# Patient Record
Sex: Male | Born: 1950 | Race: White | Hispanic: No | Marital: Married | Smoking: Never smoker
Health system: Southern US, Community
[De-identification: ages and names within clinical notes are randomized; demographics above are authoritative.]

## PROBLEM LIST (undated history)

## (undated) DIAGNOSIS — I4891 Unspecified atrial fibrillation: Secondary | ICD-10-CM

## (undated) DIAGNOSIS — G4730 Sleep apnea, unspecified: Secondary | ICD-10-CM

## (undated) DIAGNOSIS — K621 Rectal polyp: Secondary | ICD-10-CM

## (undated) DIAGNOSIS — K635 Polyp of colon: Secondary | ICD-10-CM

## (undated) HISTORY — PX: HERNIA REPAIR: SHX51

---

## 2001-01-28 ENCOUNTER — Ambulatory Visit (HOSPITAL_COMMUNITY): Admission: RE | Admit: 2001-01-28 | Discharge: 2001-01-28 | Payer: Self-pay | Admitting: General Surgery

## 2002-02-03 ENCOUNTER — Ambulatory Visit (HOSPITAL_COMMUNITY): Admission: RE | Admit: 2002-02-03 | Discharge: 2002-02-03 | Payer: Self-pay | Admitting: General Surgery

## 2002-11-03 ENCOUNTER — Ambulatory Visit (HOSPITAL_COMMUNITY): Admission: RE | Admit: 2002-11-03 | Discharge: 2002-11-03 | Payer: Self-pay | Admitting: Internal Medicine

## 2006-01-28 ENCOUNTER — Ambulatory Visit: Payer: Self-pay | Admitting: Internal Medicine

## 2006-01-28 ENCOUNTER — Ambulatory Visit (HOSPITAL_COMMUNITY): Admission: RE | Admit: 2006-01-28 | Discharge: 2006-01-28 | Payer: Self-pay | Admitting: Internal Medicine

## 2006-02-04 ENCOUNTER — Observation Stay (HOSPITAL_COMMUNITY): Admission: RE | Admit: 2006-02-04 | Discharge: 2006-02-05 | Payer: Self-pay | Admitting: General Surgery

## 2011-01-18 ENCOUNTER — Encounter (INDEPENDENT_AMBULATORY_CARE_PROVIDER_SITE_OTHER): Payer: Self-pay | Admitting: *Deleted

## 2011-02-14 ENCOUNTER — Telehealth (INDEPENDENT_AMBULATORY_CARE_PROVIDER_SITE_OTHER): Payer: Self-pay | Admitting: *Deleted

## 2011-02-14 DIAGNOSIS — Z86010 Personal history of colonic polyps: Secondary | ICD-10-CM

## 2011-02-14 MED ORDER — PEG-KCL-NACL-NASULF-NA ASC-C 100 G PO SOLR: 1.0000 | Freq: Once | ORAL | Status: DC

## 2011-02-14 NOTE — Telephone Encounter (Signed)
TCS sch'd 05/30/11 @ 8:30 (7:30), movi prep instructions mailed

## 2011-05-30 ENCOUNTER — Ambulatory Visit: Admit: 2011-05-30 | Payer: Self-pay | Admitting: Internal Medicine

## 2011-05-30 SURGERY — COLONOSCOPY
Anesthesia: Moderate Sedation

## 2011-11-13 ENCOUNTER — Encounter (INDEPENDENT_AMBULATORY_CARE_PROVIDER_SITE_OTHER): Payer: Self-pay | Admitting: *Deleted

## 2011-11-29 ENCOUNTER — Telehealth (INDEPENDENT_AMBULATORY_CARE_PROVIDER_SITE_OTHER): Payer: Self-pay | Admitting: *Deleted

## 2011-11-29 ENCOUNTER — Other Ambulatory Visit (INDEPENDENT_AMBULATORY_CARE_PROVIDER_SITE_OTHER): Payer: Self-pay | Admitting: *Deleted

## 2011-11-29 DIAGNOSIS — Z1211 Encounter for screening for malignant neoplasm of colon: Secondary | ICD-10-CM

## 2011-11-29 DIAGNOSIS — Z86010 Personal history of colonic polyps: Secondary | ICD-10-CM

## 2011-11-29 NOTE — Telephone Encounter (Signed)
Patient needs movi prep 

## 2011-11-30 MED ORDER — PEG-KCL-NACL-NASULF-NA ASC-C 100 G PO SOLR: 1.0000 | Freq: Once | ORAL | Status: DC

## 2012-01-08 ENCOUNTER — Encounter (INDEPENDENT_AMBULATORY_CARE_PROVIDER_SITE_OTHER): Payer: Self-pay | Admitting: *Deleted

## 2012-01-09 ENCOUNTER — Encounter (INDEPENDENT_AMBULATORY_CARE_PROVIDER_SITE_OTHER): Payer: Self-pay | Admitting: *Deleted

## 2012-02-06 ENCOUNTER — Telehealth (INDEPENDENT_AMBULATORY_CARE_PROVIDER_SITE_OTHER): Payer: Self-pay | Admitting: *Deleted

## 2012-02-06 NOTE — Telephone Encounter (Signed)
PCP/Requesting MD: Sudie Bailey  Name & DOB: allin frix 11-01-2050     Procedure: tcs  Reason/Indication:  Hx polyps  Has patient had this procedure before?  yes  If so, when, by whom and where?  01/2006  Is there a family history of colon cancer?  no  Who?  What age when diagnosed?    Is patient diabetic?   no      Does patient have prosthetic heart valve?  no  Do you have a pacemaker?  no  Has patient had joint replacement within last 12 months?  no  Is patient on Coumadin, Plavix and/or Aspirin? no  Medications: none  Allergies: nkda  Medication Adjustment:   Procedure date & time: 02/27/12 at 830

## 2012-02-08 NOTE — Telephone Encounter (Signed)
agree

## 2012-02-14 ENCOUNTER — Encounter (HOSPITAL_COMMUNITY): Payer: Self-pay | Admitting: Pharmacy Technician

## 2012-02-26 MED ORDER — SODIUM CHLORIDE 0.45 % IV SOLN: Freq: Once | INTRAVENOUS | Status: DC

## 2012-02-27 ENCOUNTER — Encounter (HOSPITAL_COMMUNITY)
Admission: RE | Disposition: A | Discharge status: Home / Self Care | Payer: Self-pay | Source: Ambulatory Visit | Attending: Internal Medicine

## 2012-02-27 ENCOUNTER — Encounter (HOSPITAL_COMMUNITY): Payer: Self-pay | Admitting: *Deleted

## 2012-02-27 ENCOUNTER — Ambulatory Visit (HOSPITAL_COMMUNITY)
Admission: RE | Admit: 2012-02-27 | Discharge: 2012-02-27 | Disposition: A | Discharge status: Home / Self Care | Payer: BC Managed Care – PPO | Source: Ambulatory Visit | Attending: Internal Medicine | Admitting: Internal Medicine

## 2012-02-27 DIAGNOSIS — D128 Benign neoplasm of rectum: Secondary | ICD-10-CM | POA: Insufficient documentation

## 2012-02-27 DIAGNOSIS — K5730 Diverticulosis of large intestine without perforation or abscess without bleeding: Secondary | ICD-10-CM

## 2012-02-27 DIAGNOSIS — Z860100 Personal history of colon polyps, unspecified: Secondary | ICD-10-CM | POA: Insufficient documentation

## 2012-02-27 DIAGNOSIS — D129 Benign neoplasm of anus and anal canal: Secondary | ICD-10-CM | POA: Insufficient documentation

## 2012-02-27 DIAGNOSIS — D126 Benign neoplasm of colon, unspecified: Secondary | ICD-10-CM

## 2012-02-27 HISTORY — PX: COLONOSCOPY: SHX5424

## 2012-02-27 HISTORY — DX: Polyp of colon: K63.5

## 2012-02-27 HISTORY — DX: Rectal polyp: K62.1

## 2012-02-27 SURGERY — COLONOSCOPY
Anesthesia: Moderate Sedation

## 2012-02-27 MED ORDER — STERILE WATER FOR IRRIGATION IR SOLN
Status: DC | PRN
  Administered 2012-02-27: 08:00:00

## 2012-02-27 MED ORDER — MEPERIDINE HCL 50 MG/ML IJ SOLN
INTRAMUSCULAR | Status: DC | PRN
  Administered 2012-02-27 (×2): 25 mg via INTRAVENOUS

## 2012-02-27 MED ORDER — MEPERIDINE HCL 50 MG/ML IJ SOLN
INTRAMUSCULAR | Status: AC
  Filled 2012-02-27: qty 1

## 2012-02-27 MED ORDER — MIDAZOLAM HCL 5 MG/5ML IJ SOLN
INTRAMUSCULAR | Status: DC | PRN
  Administered 2012-02-27 (×5): 2 mg via INTRAVENOUS

## 2012-02-27 MED ORDER — MIDAZOLAM HCL 5 MG/5ML IJ SOLN
INTRAMUSCULAR | Status: AC
  Filled 2012-02-27: qty 10

## 2012-02-27 NOTE — Op Note (Signed)
COLONOSCOPY PROCEDURE REPORT  PATIENT:  Robert Gillespie  MR#:  [id number] Birthdate:  25-May-1951, 61 y.o., male Endoscopist:  Dr. Malissa Hippo, MD Referred By:  Dr. Mila Homer. Sudie Bailey, MD Procedure Date: 02/27/2012  Procedure:   Colonoscopy and snare polypectomy.  Indications:  Patient is 61 year old Caucasian male with history of colonic adenomas. He's had multiple adenomas removed and 2 prior colonoscopies. His last exam was in August 2007.  Informed Consent:  The procedure and risks were reviewed with the patient and informed consent was obtained.  Medications:  Demerol 50 mg IV Versed 10 mg IV  Description of procedure:  After a digital rectal exam was performed, that colonoscope was advanced from the anus through the rectum and colon to the area of the cecum, ileocecal valve and appendiceal orifice. The cecum was deeply intubated. These structures were well-seen and photographed for the record. From the level of the cecum and ileocecal valve, the scope was slowly and cautiously withdrawn. The mucosal surfaces were carefully surveyed utilizing scope tip to flexion to facilitate fold flattening as needed. The scope was pulled down into the rectum where a thorough exam including retroflexion was performed.  Findings:   Prep excellent. Scattered diverticula at sigmoid and descending colon. Or small polyps are ablated via cold biopsy and submitted in one container(1 at ascending colon and 3 at transverse colon). 7 mm sessile polyp snared from hepatic flexure following saline injection. 7-8 mm polyp snared from splenic flexure. 12 mm polyp snared from rectosigmoid junction. One small polyp at descending colon was coagulated using snare tip. Normal rectal mucosa and anal rectal junction.  Therapeutic/Diagnostic Maneuvers Performed:  see above  Complications:  None  Cecal Withdrawal Time:  14 minutes  Impression:  Examination performed to cecum. Scattered diverticula at sigmoid  and descending. Four small polyps ablated via cold biopsy and submitted together. Small polyp at descending colon was ablated using snare tip. Three polyps were snared as above. Largest one was located at rectosigmoid junction and was 12 mm in diameter.   Recommendations:  Standard instructions given. Will contact patient with results of biopsy and further recommendations.  Nadim Malia U  02/27/2012 9:31 AM  CC: Dr. Milana Obey, MD & Dr. Bonnetta Barry ref. provider found

## 2012-02-27 NOTE — H&P (Addendum)
Robert Gillespie is an 61 y.o. male.   Chief Complaint: Patient is here for colonoscopy. HPI: Patient is 61 year old Caucasian male with history of colonic adenomas and is here for surveillance examination. He has had multiple adenomas removed on his to prior exams. The first one was in 2004 and her last exam was in August 2007 and he had 5 small polyps removed and they were all tubular adenomas.. He denies change in his bowel habits abdominal pain rectal bleeding. Family history is negative for colorectal carcinoma to He does not take any medications.  Past Medical History  Diagnosis Date  . Colorectal polyps     history of     Past Surgical History  Procedure Date  . Hernia repair     History reviewed. No pertinent family history. Social History:  reports that he has never smoked. He does not have any smokeless tobacco history on file. He reports that he drinks alcohol. He reports that he does not use illicit drugs.  Allergies: No Known Allergies  No prescriptions prior to admission    No results found for this or any previous visit (from the past 48 hour(s)). No results found.  ROS  Blood pressure 128/70, pulse 54, temperature 97.6 F (36.4 C), temperature source Oral, resp. rate 19, height 6\' 4"  (1.93 m), weight 203 lb (92.08 kg), SpO2 95.00%. Physical Exam  Constitutional: He appears well-developed and well-nourished.  HENT:  Mouth/Throat: Oropharynx is clear and moist.  Eyes: Conjunctivae normal are normal. No scleral icterus.  Neck: No thyromegaly present.  Cardiovascular: Normal rate, regular rhythm and normal heart sounds.   Respiratory: Effort normal and breath sounds normal.  GI: Soft. He exhibits no distension and no mass. There is no tenderness.  Musculoskeletal: He exhibits no edema.  Lymphadenopathy:    He has no cervical adenopathy.  Neurological: He is alert.  Skin: Skin is warm and dry.     Assessment/Plan History of colonic adenomas. Surveillance  colonoscopy.  Marianela Mandrell U 02/27/2012, 8:23 AM

## 2012-02-29 ENCOUNTER — Encounter (HOSPITAL_COMMUNITY): Payer: Self-pay | Admitting: Internal Medicine

## 2012-03-12 ENCOUNTER — Encounter (INDEPENDENT_AMBULATORY_CARE_PROVIDER_SITE_OTHER): Payer: Self-pay | Admitting: *Deleted

## 2014-06-11 DIAGNOSIS — I639 Cerebral infarction, unspecified: Secondary | ICD-10-CM

## 2014-06-11 HISTORY — DX: Cerebral infarction, unspecified: I63.9

## 2014-06-11 HISTORY — PX: BRAIN AVM REPAIR: SHX202

## 2014-10-30 ENCOUNTER — Emergency Department
Admission: EM | Admit: 2014-10-30 | Discharge: 2014-10-30 | Disposition: A | Discharge status: Other Acute Inpatient Hospital | Payer: BLUE CROSS/BLUE SHIELD | Attending: Emergency Medicine | Admitting: Emergency Medicine

## 2014-10-30 ENCOUNTER — Emergency Department: Payer: BLUE CROSS/BLUE SHIELD

## 2014-10-30 ENCOUNTER — Encounter: Payer: Self-pay | Admitting: Emergency Medicine

## 2014-10-30 ENCOUNTER — Other Ambulatory Visit: Payer: Self-pay

## 2014-10-30 DIAGNOSIS — I619 Nontraumatic intracerebral hemorrhage, unspecified: Secondary | ICD-10-CM

## 2014-10-30 DIAGNOSIS — R51 Headache: Secondary | ICD-10-CM | POA: Diagnosis present

## 2014-10-30 DIAGNOSIS — I614 Nontraumatic intracerebral hemorrhage in cerebellum: Secondary | ICD-10-CM | POA: Insufficient documentation

## 2014-10-30 LAB — CBC
HEMATOCRIT: 45.1 % (ref 40.0–52.0)
HEMOGLOBIN: 15.3 g/dL (ref 13.0–18.0)
MCH: 33.3 pg (ref 26.0–34.0)
MCHC: 33.8 g/dL (ref 32.0–36.0)
MCV: 98.5 fL (ref 80.0–100.0)
PLATELETS: 164 10*3/uL (ref 150–440)
RBC: 4.58 MIL/uL (ref 4.40–5.90)
RDW: 12.7 % (ref 11.5–14.5)
WBC: 9.3 10*3/uL (ref 3.8–10.6)

## 2014-10-30 LAB — PROTIME-INR
INR: 0.9
Prothrombin Time: 12.4 seconds (ref 11.4–15.0)

## 2014-10-30 LAB — DIFFERENTIAL
BASOS PCT: 0 %
Basophils Absolute: 0 10*3/uL (ref 0–0.1)
Eosinophils Absolute: 0.1 10*3/uL (ref 0–0.7)
Eosinophils Relative: 1 %
LYMPHS ABS: 1.7 10*3/uL (ref 1.0–3.6)
LYMPHS PCT: 19 %
MONOS PCT: 8 %
Monocytes Absolute: 0.7 10*3/uL (ref 0.2–1.0)
NEUTROS ABS: 6.7 10*3/uL — AB (ref 1.4–6.5)
NEUTROS PCT: 72 %

## 2014-10-30 LAB — COMPREHENSIVE METABOLIC PANEL
ALK PHOS: 69 U/L (ref 38–126)
ALT: 16 U/L — AB (ref 17–63)
AST: 21 U/L (ref 15–41)
Albumin: 4.5 g/dL (ref 3.5–5.0)
Anion gap: 8 (ref 5–15)
BUN: 12 mg/dL (ref 6–20)
CALCIUM: 9.6 mg/dL (ref 8.9–10.3)
CHLORIDE: 104 mmol/L (ref 101–111)
CO2: 26 mmol/L (ref 22–32)
CREATININE: 0.67 mg/dL (ref 0.61–1.24)
GFR calc non Af Amer: >60 mL/min (ref >60)
GLUCOSE: 102 mg/dL — AB (ref 65–99)
Potassium: 4.2 mmol/L (ref 3.5–5.1)
SODIUM: 138 mmol/L (ref 135–145)
Total Bilirubin: 0.5 mg/dL (ref 0.3–1.2)
Total Protein: 7.1 g/dL (ref 6.5–8.1)

## 2014-10-30 LAB — APTT: APTT: 26 s (ref 24–36)

## 2014-10-30 MED ORDER — LABETALOL HCL 5 MG/ML IV SOLN
10.0000 mg | Freq: Once | INTRAVENOUS | Status: AC
  Administered 2014-10-30: 10 mg via INTRAVENOUS

## 2014-10-30 MED ORDER — LABETALOL HCL 5 MG/ML IV SOLN
INTRAVENOUS | Status: AC
  Administered 2014-10-30: 10 mg via INTRAVENOUS
  Filled 2014-10-30: qty 4

## 2014-10-30 NOTE — ED Notes (Signed)
MD at bedside updating patient on CT results. Family at bedside.

## 2014-10-30 NOTE — ED Notes (Signed)
Headache began 3p, affecting vision and balance

## 2014-10-30 NOTE — ED Provider Notes (Signed)
[facility] Emergency Department Provider Note  ____________________________________________  Time seen: 1700  I have reviewed the triage vital signs and the nursing notes.   HISTORY  Chief Complaint Headache, off balance, vertigo    HPI Robert Gillespie is a 64 y.o. male who reports he had a headache on Wednesday. It was relatively brief and went away. This afternoon at 3:00 I headache returned. It was in the right side of his head but has now shifted towards the back and left part of his head. He reports he is having some difficulty walking due to feeling off balance. He says his vision appears a little bit blurry. He is alert and communicative. He denies history of similar in the past. He denies any focal weakness.   Past Medical History  Diagnosis Date  . Colorectal polyps     history of     There are no active problems to display for this patient.   Past Surgical History  Procedure Laterality Date  . Hernia repair    . Colonoscopy  02/27/2012    Procedure: COLONOSCOPY;  Surgeon: Rogene Houston, MD;  [facility];  Service: Endoscopy;  Laterality: N/A;  8:30    Current Outpatient Rx  Name  Route  Sig  Dispense  Refill  . HYDROcodone-acetaminophen (NORCO) 7.5-325 MG per tablet   Oral   Take 1 tablet by mouth 2 (two) times daily as needed for moderate pain or severe pain (for back pain).         Marland Kitchen ibuprofen (ADVIL,MOTRIN) 800 MG tablet   Oral   Take 800 mg by mouth every 8 (eight) hours as needed for mild pain or moderate pain.           Allergies Review of patient's allergies indicates no known allergies.  History reviewed. No pertinent family history.  Social History History  Substance Use Topics  . Smoking status: Never Smoker   . Smokeless tobacco: Not on file  . Alcohol Use: 3.6 - 4.8 oz/week    6-8 Cans of beer per week     Comment: 6-8 beers a day    Review of Systems  Constitutional: Negative for  fever. ENT: Negative for sore throat. Cardiovascular: Negative for chest pain. Respiratory: Negative for shortness of breath. Gastrointestinal: Negative for abdominal pain, vomiting and diarrhea. Genitourinary: Negative for dysuria. Musculoskeletal: Negative for back pain. Skin: Negative for rash. Neurological: Positive for headache and vertigo. See history of present illness. 10-point ROS otherwise negative.  ____________________________________________   PHYSICAL EXAM:  VITAL SIGNS: ED Triage Vitals  Enc Vitals Group     BP 10/30/14 1648 167/83 mmHg     Pulse Rate 10/30/14 1648 63     Resp 10/30/14 1648 20     Temp 10/30/14 1648 98.1 F (36.7 C)     Temp Source 10/30/14 1648 Oral     SpO2 10/30/14 1648 98 %     Weight 10/30/14 1648 200 lb (90.719 kg)     Height 10/30/14 1648 6\' 3"  (1.905 m)     Head Cir --      Peak Flow --      Pain Score 10/30/14 1649 6     Pain Loc --      Pain Edu? --      Excl. in [place]? --     Constitutional: Alert and oriented. Well appearing and in no distress. ENT   Head: Normocephalic and atraumatic.   Nose: No congestion/rhinnorhea.  Mouth/Throat: Mucous membranes are moist. Cardiovascular: Normal rate, regular rhythm. Respiratory: Normal respiratory effort without tachypnea. Breath sounds are clear and equal bilaterally. No wheezes/rales/rhonchi. Gastrointestinal: Soft and nontender. No distention.  Back: There is no CVA tenderness. Musculoskeletal: Nontender with normal range of motion in all extremities.  No noted edema. Neurologic:  Normal speech and language. No gross focal neurologic deficits are appreciated.  Equal grip strength. Negative pronator drift. No sway with eyes closed during the pronator drift assessment SEATED in bed. Normal finger to nose. Normal hand flap on thigh. Normal heel to shin bilaterally. 5 or 5 strength in all 4 extremities. Cranial nerves II through XII are intact. The patient is alert and  communicative. Skin:  Skin is warm, dry. No rash noted. Psychiatric: Mood and affect are normal. Speech and behavior are normal.  ____________________________________________    LABS (pertinent positives/negatives)  CBC is within normal limits with white blood count 9.3 hemoglobin of 15.3. [name] has a metabolic panel is within normal limits with a potassium of 4.2. coagulation studies are normal without PT of 12.4 INR of 0.9 and a PTT of 26. ____________________________________________   EKG  EKG obtained at 1657. Interpreted by me. Normal sinus rhythm at a rate of 60 with normal intervals and normal axis. Normal EKG.  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 Mild local mass-effect in the fourth ventricle. Differential diagnosis includes hypertension, cocaine, vascular malformation, and tumor. Further imaging workup is suggested.  ____________________________________________   PROCEDURES  Critical Care performed: Critically ill patient with a and acute hemorrhagic CVA. We have treated him with labetalol IV to reduce his blood pressure. We have called [facility] and spoken with the neuro intensivist for ongoing care. We have spoken with the patient and his family. Total critical care time of 35 minutes.   ____________________________________________   INITIAL IMPRESSION / ASSESSMENT AND PLAN / ED COURSE  Acute hemorrhagic stroke. Treat blood pressure with labetalol IV. Call to [facility] after discussing transfer with patient.  ----------------------------------------- 5:56 PM on 10/30/2014 -----------------------------------------  Discussed with Dr. Kasandra Knudsen at [facility]. He accepts the patient in transfer. We are waiting on word about critical care transport availability. ____________________________________________   FINAL  CLINICAL IMPRESSION(S) / ED DIAGNOSES  Final diagnoses:  Hemorrhagic stroke   acute, cerebellum. Headache, vertigo    Ahmed Prima, MD 10/30/14 2128

## 2014-11-29 ENCOUNTER — Encounter: Payer: Self-pay | Admitting: Physical Therapy

## 2014-11-29 ENCOUNTER — Ambulatory Visit: Payer: BLUE CROSS/BLUE SHIELD | Attending: Neurological Surgery | Admitting: Physical Therapy

## 2014-11-29 DIAGNOSIS — Q282 Arteriovenous malformation of cerebral vessels: Secondary | ICD-10-CM | POA: Insufficient documentation

## 2014-11-29 DIAGNOSIS — R41841 Cognitive communication deficit: Secondary | ICD-10-CM | POA: Diagnosis not present

## 2014-11-29 DIAGNOSIS — R262 Difficulty in walking, not elsewhere classified: Secondary | ICD-10-CM | POA: Insufficient documentation

## 2014-11-29 NOTE — Therapy (Signed)
[place] [address] [place], [place], [address] Phone: [phone]   Fax:  [phone]  Physical Therapy Evaluation  Patient Details  Name: Robert Gillespie MRN: [id number] Date of Birth: Feb 21, 1951 Referring Provider:  Lemmie Evens, MD  Encounter Date: 11/29/2014      PT End of Session - 11/29/14 1504    Visit Number 1   Number of Visits 25   Date for PT Re-Evaluation 03/01/15   PT Start Time 0145   PT Stop Time 0245   PT Time Calculation (min) 60 min   Equipment Utilized During Treatment Gait belt   Activity Tolerance Patient tolerated treatment well   Behavior During Therapy [facility] for tasks assessed/performed      Past Medical History  Diagnosis Date  . Colorectal polyps     history of     Past Surgical History  Procedure Laterality Date  . Hernia repair    . Colonoscopy  02/27/2012    Procedure: COLONOSCOPY;  Surgeon: Rogene Houston, MD;  [facility];  Service: Endoscopy;  Laterality: N/A;  8:30    There were no vitals filed for this visit.  Visit Diagnosis:  Difficulty walking      Subjective Assessment - 11/29/14 1348    Subjective Patient wants to be able to ambulate wihtout the RW, and have better balance.             [facility] PT Assessment - 11/29/14 0001    Assessment   Medical Diagnosis AVM   Onset Date/Surgical Date 10/30/14   Hand Dominance Right   Next MD Visit December 30, 2014   Prior Therapy no   Precautions   Precautions Fall   Balance Screen   Has the patient fallen in the past 6 months Yes   How many times? 1   Has the patient had a decrease in activity level because of a fear of falling?  Yes   Is the patient reluctant to leave their home because of a fear of falling?  Yes   [place] Private residence   Living Arrangements Spouse/significant other   Available Help at Discharge Family   Type of [place] to  enter   Entrance Stairs-Number of Steps [place] One level   [place] - 2 wheels   Prior Function   Level of Independence Independent   Vocation Full time employment   Vocation Requirements amusement buisness   Leisure work   Functional Tests   Functional tests Sit to [employer] to Stand   Comments 5 x sit to stand 36.56 sec       10 MW .83 m/sec    TUG n 24.43 sec  6 MW700 feet    Berg         Standardized Balance Assessment   Standardized Balance Assessment Berg Balance Test   Berg Balance Test   Sit to Stand Able to stand  independently using hands   Standing Unsupported Able to stand safely 2 minutes   Sitting with Back Unsupported but Feet Supported on Floor or Stool Able to sit safely and securely 2 minutes   Stand to Sit Controls descent by using hands   Transfers Able to transfer with verbal cueing and /or supervision   Standing Unsupported with Eyes Closed Able to stand 10 seconds safely  Standing Ubsupported with Feet Together Able to place feet together independently and stand 1 minute safely   From Standing, Reach Forward with Outstretched Arm Can reach forward >12 cm safely (5")   From Standing Position, Pick up Object from Floor Able to pick up shoe, needs supervision   From Standing Position, Turn to Look Behind Over each Shoulder Turn sideways only but maintains balance   Turn 360 Degrees Able to turn 360 degrees safely but slowly   Standing Unsupported, Alternately Place Feet on Step/Stool Able to complete >2 steps/needs minimal assist   Standing Unsupported, One Foot in [employer] balance while stepping or standing   Standing on One Leg Unable to try or needs assist to prevent fall   Total Score 35       Patient is independent with bed mobility supine to sit and sit to supine Transfers and ambulation are SBA for household distances with RW Strength is 5/5 BLE knees, hips  Ext 3/5, abd 4/5, add 3+/5,  Patient  has + elys bilaterally and tight hip flexor muscles bilaterally Dynamic standing balance is poor static standing is fair Seated dynamic and static balance is good                    PT Education - 11/29/14 1504    Education provided Yes   Education Details HEP   Person(s) Educated Patient   Methods Explanation;Demonstration;Tactile cues   Comprehension Verbalized understanding;Verbal cues required             PT Long Term Goals - 11/29/14 1509    PT LONG TERM GOAL #1   Title Patient will increase 10 meter walk test to >1.50m/s as to improve gait speed for better community ambulation and to reduce fall risk  03-01-15   PT LONG TERM GOAL #2   Title Patient will increase Berg Balance score by > 6 points to demonstrate decreased fall risk during functional activities. 03-01-15   PT LONG TERM GOAL #3   Title Patient will reduce timed up and go to <11 seconds to reduce fall risk and demonstrate improved transfer/gait ability 03-01-15   PT LONG TERM GOAL #4   Title Patient will be independent in home exercise program to improve strength/mobility for better functional independence with ADLs. 03-01-15   PT LONG TERM GOAL #5   Title Patient will tolerate 5 seconds of single leg stance without loss of balance to improve ability to get in and out of shower safely 03-01-15   PT LONG TERM GOAL #6   Title Patient will increase BLE gross strength to 4+/5 as to improve functional strength for independent gait, increased standing tolerance and increased ADL ability.Patient will increase BLE gross strength to 4+/5 as to improve functional strength for independent gait, increased standing tolerance and increased ADL ability.                Problem List There are no active problems to display for this patient.   Arelia Sneddon S 11/29/2014, 3:17 PM  [place] [facility] SERVICES [address] [place], [place], [address] Phone:  [phone]   Fax:  [phone]

## 2014-12-01 ENCOUNTER — Ambulatory Visit: Payer: BLUE CROSS/BLUE SHIELD | Admitting: Physical Therapy

## 2014-12-01 ENCOUNTER — Encounter: Payer: Self-pay | Admitting: Physical Therapy

## 2014-12-01 DIAGNOSIS — R262 Difficulty in walking, not elsewhere classified: Secondary | ICD-10-CM

## 2014-12-01 NOTE — Patient Instructions (Signed)
SIT TO STAND: No Device   Sit with feet shoulder-width apart, on floor.(Make sure that you are in a chair that won't move like a chair against a wall or couch etc) Lean chest forward, raise hips up from surface. Straighten hips and knees. Weight bear equally on left and right sides. 10___ reps per set, _2__ sets per day, _5__ days per week Place left leg closer to sitting surface.  Balance, Proprioception: Hip Abduction With Tubing   With tubing attached to both ankles, Standing holding onto counter, kick one leg out to side and then Return.  Repeat _10___ times  On each side.  Do ___2_ sessions per day.  http://cc.exer.us/20   Copyright  VHI. All rights reserved.    Copyright  VHI. All rights reserved.  Balance, Proprioception: Hip Extension With Tubing   With tubing tied around both legs, holding onto kitchen counter, swing leg back. Return. Be sure to avoid forward trunk lean Repeat _10___ times . Do __2__ sessions per day.  http://cc.exer.us/19   Copyright  VHI. All rights reserved.  Balance, Proprioception: Hip Flexion With Tubing   With tubing attached to both ankles, swing leg forward. Return. Repeat _10___ times. Do __2__ sessions per day.  http://cc.exer.us/18   Copyright  VHI. All rights reserved.

## 2014-12-01 NOTE — Therapy (Signed)
[place] [facility] SERVICES [address] [place], [place], [address] Phone: [phone]   Fax:  [phone]  Physical Therapy Treatment  Patient Details  Name: Robert Gillespie MRN: [id number] Date of Birth: 07-09-1950 Referring Provider:  Lemmie Evens, MD  Encounter Date: 12/01/2014      PT End of Session - 12/01/14 1116    Visit Number 2   Number of Visits 25   Date for PT Re-Evaluation 03/01/15   PT Start Time 1115   PT Stop Time 1200   PT Time Calculation (min) 45 min   Equipment Utilized During Treatment Gait belt   Activity Tolerance Patient tolerated treatment well   Behavior During Therapy [facility] for tasks assessed/performed      Past Medical History  Diagnosis Date  . Colorectal polyps     history of     Past Surgical History  Procedure Laterality Date  . Hernia repair    . Colonoscopy  02/27/2012    Procedure: COLONOSCOPY;  Surgeon: Rogene Houston, MD;  [facility];  Service: Endoscopy;  Laterality: N/A;  8:30    There were no vitals filed for this visit.  Visit Diagnosis:  Difficulty walking      Subjective Assessment - 12/01/14 1020    Subjective Pt reports that he is doing good upon arrival to PT on this day. Pt reports that he has been compliant with HEP, performing these exercise once since last PT visit; sit to stand, standing hip Abduction, hip flexion, and hip extension.      Patient is accompained by: Family member   Limitations Walking;Lifting;Standing   Patient Stated Goals improve balance and decrease dependance on AD    Currently in Pain? No/denies   Multiple Pain Sites --             TREATMENT:   Nustep level 2 4 min (unbilled)   HEP Review:  Sit to stand x10 with use of hand rail Standing hip flexion yellow tband , x10 each leg  Standing hip Extension Yellow tband x10 each leg Standing hip abduction  Yellow tband x10 each leg.   Verbal instruction for improve exercise  technique and decreased use of compensatory movements, including decreased trunk movement and improved eccentric control with standing exercises and increased speed of movement with sit to stand   Balance therapeutic exercises.    Standing feet together eyes open 30 seconds x2, eyes closed 30 seconds x2  Tandem stance, bilaterally 1-0 HHA 2x30 seconds each LE, able to keep balance without HHA up to 8 seconds with R, and 10 seconds with L SLS Bilaterally 1-0 HHA, 2x30 seconds each LE, able to keep balance without HHA for up to 7 seconds on R and 12 seconds on L.  Step up to 5 inch step, no HHA 15 each LE. Posterior LOB x1 required use of hands on parallel bars to correct  Toe taps on 4 inch step, no HHA 15 each LE. Posterior LOB x 3 required use of hands on parallel bars to correct  For balance activities PT provided CGA for increased safety with LOB, verbal instruction and tactile cues to increase use of hip strategy with posterior LOB, and proper technique with exercises including improved eccentric control with step up/down.                       PT Education - 12/01/14 1116    Education provided Yes  Education Details HEP review, balance exercises, proper use of RW for household and community ambulation   Person(s) Educated Patient;Spouse   Methods Explanation;Demonstration;Tactile cues;Verbal cues;Handout   Comprehension Verbalized understanding;Returned demonstration             PT Long Term Goals - 11/29/14 1509    PT LONG TERM GOAL #1   Title Patient will increase 10 meter walk test to >1.52m/s as to improve gait speed for better community ambulation and to reduce fall risk  03-01-15   PT LONG TERM GOAL #2   Title Patient will increase Berg Balance score by > 6 points to demonstrate decreased fall risk during functional activities. 03-01-15   PT LONG TERM GOAL #3   Title Patient will reduce timed up and go to <11 seconds to reduce fall risk and demonstrate  improved transfer/gait ability 03-01-15   PT LONG TERM GOAL #4   Title Patient will be independent in home exercise program to improve strength/mobility for better functional independence with ADLs. 03-01-15   PT LONG TERM GOAL #5   Title Patient will tolerate 5 seconds of single leg stance without loss of balance to improve ability to get in and out of shower safely 03-01-15   PT LONG TERM GOAL #6   Title Patient will increase BLE gross strength to 4+/5 as to improve functional strength for independent gait, increased standing tolerance and increased ADL ability.Patient will increase BLE gross strength to 4+/5 as to improve functional strength for independent gait, increased standing tolerance and increased ADL ability.               Plan - 12/01/14 1117    Clinical Impression Statement PT provided pt with review of HEP, correction provided for improper standing hip exercise technique. Pt demonstrates decreased stabilty with narrow BOS balance tasks and decreased righting responses for posterior LOB. PT educated pt with use of hip strategy to correct LOB, with practice pt was able to demonstrate improved balance correction. Continued skilled PT required to improve LE strength, increase endurance, and improve balance to increase indepence with ADLS.    PT Next Visit Plan balance exercises. LE strengthening exercises   PT Home Exercise Plan see pt instructions   Consulted and Agree with Plan of Care Patient        Problem List There are no active problems to display for this patient.  Barrie Folk SPT 12/01/2014   11:35 AM   Hopkins,Margaret, PT DPT 12/01/2014, 1:08 PM  [place] MA[facility] SERVICES [address] [place], [place], [address] Phone: [phone]   Fax:  [phone]

## 2014-12-06 ENCOUNTER — Encounter: Payer: Self-pay | Admitting: Physical Therapy

## 2014-12-06 ENCOUNTER — Ambulatory Visit: Payer: BLUE CROSS/BLUE SHIELD | Admitting: Physical Therapy

## 2014-12-06 DIAGNOSIS — R262 Difficulty in walking, not elsewhere classified: Secondary | ICD-10-CM

## 2014-12-06 NOTE — Therapy (Signed)
[place] MAIN [facility] SERVICES [address] [place], [place], [address] Phone: [phone]   Fax:  [phone]  Physical Therapy Treatment  Patient Details  Name: Robert Gillespie MRN: [id number] Date of Birth: February 12, 1951 Referring Provider:  Lemmie Evens, MD  Encounter Date: 12/06/2014      PT End of Session - 12/06/14 1741    Visit Number 3   Number of Visits 25   Date for PT Re-Evaluation 03/01/15   PT Start Time 1633   PT Stop Time 1716   PT Time Calculation (min) 43 min   Equipment Utilized During Treatment Gait belt   Activity Tolerance Patient tolerated treatment well   Behavior During Therapy [facility] for tasks assessed/performed      Past Medical History  Diagnosis Date  . Colorectal polyps     history of     Past Surgical History  Procedure Laterality Date  . Hernia repair    . Colonoscopy  02/27/2012    Procedure: COLONOSCOPY;  Surgeon: Rogene Houston, MD;  [facility];  Service: Endoscopy;  Laterality: N/A;  8:30    There were no vitals filed for this visit.  Visit Diagnosis:  Difficulty walking      Subjective Assessment - 12/06/14 1640    Subjective Pt reports that he has been doing good, notes that he had a "slow weekend", but was able to complete his HEP twice a day since last PT session.  Pt states that he has had headaches "come and go" over the past couple of days.    Patient is accompained by: Family member   Limitations Walking;Lifting;Standing   Patient Stated Goals improve balance and decrease dependance on AD    Currently in Pain? No/denies     Treatment   nustep level 3,  3min (unbilled)    Ball toss standing on airex 1 min x2  Forward Step ups on 5 inch step 0-2HHA x15 each LE Lateral steps on 5 inch steps  2 HHA x15 each LE   Toe taps on 4 inch airex. 0-2 HHA,  2x15 each LE  Standing on airex normal BOS 30seconds x3 Standing on airex narow BOS30 seconds x3  PT provided CGA and  moderate verbal and tactile instruction to increase hip strategy to prevent posterior LOB, decrease speed with step ups, proper use of UE for balance correction.    Gait training  In parallel bars with one HHA, CGA 61ft x6  SPC CGA-MinA 50x4  RW cone naviation 70ft x3  Gait to pick up object x8 for 50 ft.   For gait with SPC, Pt demonstrated 3 point gait pattern and inconsistant AD placement. PT instructed pt in 2 point gait pattern, AD placement, increase erect posture. Pt required min Ax3 to prevent lateral/posterior LOB with poor AD placement.  For gait with RW, PT provided Supervision assist and provided verbal instruction to maintain COM within BOS to increase stability.                            PT Education - 12/06/14 1741    Education provided Yes   Education Details gait with SPC, balance therex.    Person(s) Educated Patient   Methods Explanation;Demonstration;Tactile cues;Verbal cues   Comprehension Verbalized understanding;Returned demonstration;Verbal cues required;Tactile cues required             PT Long Term Goals - 11/29/14 1509    PT  LONG TERM GOAL #1   Title Patient will increase 10 meter walk test to >1.77m/s as to improve gait speed for better community ambulation and to reduce fall risk  03-01-15   PT LONG TERM GOAL #2   Title Patient will increase Berg Balance score by > 6 points to demonstrate decreased fall risk during functional activities. 03-01-15   PT LONG TERM GOAL #3   Title Patient will reduce timed up and go to <11 seconds to reduce fall risk and demonstrate improved transfer/gait ability 03-01-15   PT LONG TERM GOAL #4   Title Patient will be independent in home exercise program to improve strength/mobility for better functional independence with ADLs. 03-01-15   PT LONG TERM GOAL #5   Title Patient will tolerate 5 seconds of single leg stance without loss of balance to improve ability to get in and out of shower safely 03-01-15    PT LONG TERM GOAL #6   Title Patient will increase BLE gross strength to 4+/5 as to improve functional strength for independent gait, increased standing tolerance and increased ADL ability.Patient will increase BLE gross strength to 4+/5 as to improve functional strength for independent gait, increased standing tolerance and increased ADL ability.               Plan - 12/06/14 1742    Clinical Impression Statement Pt demonstrates improved static balance, but continues to present with dynamic balance deficits, requiring PT to prevent LOB with balance exercises x2 and with gait training x3. Pt instructed in gait with SPC on this day, demonstrating inconsistant AD management  and poor SLS balance; with verbal and tactile instruction, pt was able to improve gait mechanics. Continued skilled PT required to improve gait, balance, and strength to improve function.    Pt will benefit from skilled therapeutic intervention in order to improve on the following deficits Abnormal gait;Decreased strength;Difficulty walking;Decreased mobility;Decreased balance;Impaired flexibility   Rehab Potential Good   PT Frequency 2x / week   PT Duration 8 weeks   PT Treatment/Interventions Therapeutic exercise;Therapeutic activities;Gait training;Balance training;Functional mobility training   PT Next Visit Plan gait training with SPC, continue balance exercises.    PT Home Exercise Plan see pt instructions   Consulted and Agree with Plan of Care Patient        Problem List There are no active problems to display for this patient. Liane Comber Aneesa Romey,SPT  This entire session was performed under direct supervision and direction of a licensed therapist. I have personally read, edited and approve of the note as written.   Hopkins,Margaret, PT, DPT 12/07/2014, 8:53 AM  [place] MAIN [facility] SERVICES [address] [place], [place], [address] Phone: [phone]   Fax:   [phone]

## 2014-12-06 NOTE — Addendum Note (Signed)
Addended by: Alanson Puls on: 12/06/2014 09:01 AM   Modules accepted: Orders

## 2014-12-08 ENCOUNTER — Ambulatory Visit: Payer: BLUE CROSS/BLUE SHIELD | Admitting: Physical Therapy

## 2014-12-08 ENCOUNTER — Encounter: Payer: Self-pay | Admitting: Physical Therapy

## 2014-12-08 DIAGNOSIS — R262 Difficulty in walking, not elsewhere classified: Secondary | ICD-10-CM | POA: Diagnosis not present

## 2014-12-08 NOTE — Therapy (Signed)
[place] MAIN [facility] SERVICES [address] [place], [place], [address] Phone: [phone]   Fax:  [phone]  Physical Therapy Treatment  Patient Details  Name: Robert Gillespie MRN: [id number] Date of Birth: 04-22-1951 Referring Provider:  Lemmie Evens, MD  Encounter Date: 12/08/2014      PT End of Session - 12/08/14 1433    Visit Number 4   Number of Visits 25   Date for PT Re-Evaluation 03/01/15   PT Start Time 0200   PT Stop Time 0245   PT Time Calculation (min) 45 min   Equipment Utilized During Treatment Gait belt   Activity Tolerance Patient tolerated treatment well   Behavior During Therapy [facility] for tasks assessed/performed      Past Medical History  Diagnosis Date  . Colorectal polyps     history of     Past Surgical History  Procedure Laterality Date  . Hernia repair    . Colonoscopy  02/27/2012    Procedure: COLONOSCOPY;  Surgeon: Rogene Houston, MD;  [facility];  Service: Endoscopy;  Laterality: N/A;  8:30    There were no vitals filed for this visit.  Visit Diagnosis:  Difficulty walking      Subjective Assessment - 12/08/14 1434    Subjective --       Neuromuscular training: Fwd/stepping side stepping on blue foam, head turns left and right on blue foam, balloon toss on 1/2 foam, tapping from blue foam to stool and from floor to stool   Therapeutic exercise including ; Leg press  Single leg with 75 # 20 x 3 Gait training with RW and CGA 100 feet x 2.  Fatigue with sit to stand but demonstrating more control, Increase weight for standing exercises  Tactile cues and assistance needed to keep lower leg and knee in neutral to avoid compensations with ankle motions during all balance exericses.                               PT Long Term Goals - 11/29/14 1509    PT LONG TERM GOAL #1   Title Patient will increase 10 meter walk test to >1.23m/s as to improve gait speed  for better community ambulation and to reduce fall risk  03-01-15   PT LONG TERM GOAL #2   Title Patient will increase Berg Balance score by > 6 points to demonstrate decreased fall risk during functional activities. 03-01-15   PT LONG TERM GOAL #3   Title Patient will reduce timed up and go to <11 seconds to reduce fall risk and demonstrate improved transfer/gait ability 03-01-15   PT LONG TERM GOAL #4   Title Patient will be independent in home exercise program to improve strength/mobility for better functional independence with ADLs. 03-01-15   PT LONG TERM GOAL #5   Title Patient will tolerate 5 seconds of single leg stance without loss of balance to improve ability to get in and out of shower safely 03-01-15   PT LONG TERM GOAL #6   Title Patient will increase BLE gross strength to 4+/5 as to improve functional strength for independent gait, increased standing tolerance and increased ADL ability.Patient will increase BLE gross strength to 4+/5 as to improve functional strength for independent gait, increased standing tolerance and increased ADL ability.               Plan - 12/08/14 1434  Clinical Impression Statement Patient continues to havie dynamic and static standing balance deficits and will continue to benefit from skilled PT to improve falls risk and increase strength.         Problem List There are no active problems to display for this patient.   Arelia Sneddon S 12/08/2014, 2:36 PM  [place] MAIN [facility] SERVICES [address] [place], [place], [address] Phone: [phone]   Fax:  [phone]

## 2014-12-09 ENCOUNTER — Ambulatory Visit: Payer: BLUE CROSS/BLUE SHIELD | Admitting: Speech Pathology

## 2014-12-09 ENCOUNTER — Encounter: Payer: Self-pay | Admitting: Speech Pathology

## 2014-12-09 DIAGNOSIS — R41841 Cognitive communication deficit: Secondary | ICD-10-CM

## 2014-12-09 DIAGNOSIS — R262 Difficulty in walking, not elsewhere classified: Secondary | ICD-10-CM | POA: Diagnosis not present

## 2014-12-09 NOTE — Therapy (Signed)
[place] MAIN [facility] SERVICES [address] [place], [place], [address] Phone: [phone]   Fax:  [phone]  Speech Language Pathology Evaluation  Patient Details  Name: Robert Gillespie MRN: [id number] Date of Birth: 1950-08-22 Referring Provider:  Lemmie Evens, MD  Encounter Date: 12/09/2014      End of Session - 12/09/14 1435    Visit Number 1   Number of Visits 25   Date for SLP Re-Evaluation 03/03/15   SLP Start Time 59   SLP Stop Time  1150   SLP Time Calculation (min) 64 min   Activity Tolerance Patient tolerated treatment well      Past Medical History  Diagnosis Date  . Colorectal polyps     history of     Past Surgical History  Procedure Laterality Date  . Hernia repair    . Colonoscopy  02/27/2012    Procedure: COLONOSCOPY;  Surgeon: Rogene Houston, MD;  [facility];  Service: Endoscopy;  Laterality: N/A;  8:30    There were no vitals filed for this visit.  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    Patient is accompained by: Family member   Currently in Pain? No/denies            SLP Evaluation [facility] - 12/09/14 1105    SLP Visit Information   SLP Received On 12/09/14   Onset Date 10/30/2014   Medical Diagnosis Cerebellar hemorrhage (cerebellar anyuerism) , Arteriovenous malformation (AVM)    Subjective   Subjective Patient presents with reduced awareness of cognitive deficits occurring  since his hemorrhagic stroke 5 weeks ago. 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    Patient/Family Stated Goal Return to usual level of cognitive competence   Pain Assessment   Currently in Pain? 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 Scores from 10 tasks were used to estimate severity ratings (for age groups 18-69 years and 70-89 years) for each domain, a clock drawing task, as well as an overall composite severity rating of cognition.     Task  Score   Personal Facts                                             7/8  Symbol Cancellation                                  11/12  Confrontation Naming                                10/10  Clock Drawing                                           13/13  Story Retelling                                           5/10  Symbol Trails                                           10/10  Generative Naming                                    3/9  Design Memory                                         6/6   [place]es                                                        4/8  Design Generation                                    2/13    Cognitive Domain                               Severity Rating   Attention                                                  Mild    Memory                                                   Mild  Executive Function  Moderate  Language                                                Mild  Visuospatial Skills                                   Mild  Clock Drawing                                       WNL  Composite Severity Rating                     Mild                  Reading skills: Patient read aloud a paragraph with 95% accuracy. He correctly answered 2/5 questions about the paragraph from memory. When given the paragraph for reference, he answered 5/5 questions correctly.   Writing skills: Patient wrote his correct name and address when asked to do so. He wrote accurate descriptive phrases of a pictured scene, though he did not use complete, grammatically correct sentences.          SLP Education - 12/09/14 1434    Education provided Yes   Education Details Patient was verbally educated about the impacts of CVA/brain damage on thinking skills. Patient and family were encouraged to make a note of changes in performance of activities of daily living and safety that have occurred since the stroke.    Person(s) Educated Patient;Spouse   Methods Explanation   Comprehension Verbalized understanding;Need further instruction            SLP Long Term Goals - 12/09/14 1444    SLP LONG TERM GOAL #1   Title Patient will demonstrate functional cognitive-communication skills for successful return to work.    Time 12   Period Weeks   Status New   SLP LONG TERM GOAL #2   Title Patient will complete complex executive function skills tasks with 80% accuracy.    Time 12   Period Weeks   Status New   SLP LONG TERM GOAL #3   Title  Patient will complete complex attention tasks with 80% accuracy.    Time 12   Period Weeks   Status New   SLP LONG TERM GOAL #4   Title Patient will complete memory strategy activities with 80% accuracy.    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 The results of the Cognitive Linguistic Quick Test (CLQT) indicate a composite severity rating of mild. The patient demonstrates moderate deficits in executive function with mild deficits in the domains of attention, memory, language, and visuospatial skills. The patient performed WNL in clock drawing. The patient has functional skills in reading and writing for use of compensatory strategies.  Patient will benefit from skilled Speech Language Pathology services for restorative and compensatory treatment of attention, memory, executive function, language and visuospatial skills.    Speech Therapy Frequency 2x /  week   Duration Other (comment)  12 weeks   Treatment/Interventions Compensatory techniques;Cueing hierarchy;Functional tasks;Internal/external aids;Cognitive reorganization;SLP instruction and feedback;Patient/family education   Potential to Achieve Goals Good   Potential Considerations Ability to learn/carryover information;Co-morbidities;Cooperation/participation level;Medical prognosis;Previous level of function;Severity of impairments;Family/community support   SLP Home Exercise Plan To be determined   Consulted and Agree with Plan of Care Patient;Family member/caregiver   Family Member Consulted spouse        Problem List There are no active problems to display for this patient.  Leroy Sea, MS/CCC- SLP  Lou Miner 12/09/2014, 2:51 PM  [place] MAIN [facility] SERVICES [address] [place], [place], [address] Phone: [phone]   Fax:  [phone]

## 2014-12-14 ENCOUNTER — Ambulatory Visit: Payer: BLUE CROSS/BLUE SHIELD | Attending: Neurosurgery | Admitting: Physical Therapy

## 2014-12-14 ENCOUNTER — Encounter: Payer: Self-pay | Admitting: Physical Therapy

## 2014-12-14 DIAGNOSIS — R41841 Cognitive communication deficit: Secondary | ICD-10-CM | POA: Diagnosis not present

## 2014-12-14 DIAGNOSIS — Q2730 Arteriovenous malformation, site unspecified: Secondary | ICD-10-CM | POA: Insufficient documentation

## 2014-12-14 DIAGNOSIS — I6910 Unspecified sequelae of nontraumatic intracerebral hemorrhage: Secondary | ICD-10-CM | POA: Diagnosis present

## 2014-12-14 DIAGNOSIS — R262 Difficulty in walking, not elsewhere classified: Secondary | ICD-10-CM

## 2014-12-14 NOTE — Therapy (Signed)
[place] MAIN [facility] SERVICES [address] [place], [place], [address] Phone: [phone]   Fax:  [phone]  Physical Therapy Treatment  Patient Details  Name: Robert Gillespie MRN: [id number] Date of Birth: 1950/07/08 Referring Provider:  Derrill Memo, MD  Encounter Date: 12/14/2014      PT End of Session - 12/14/14 1650    Visit Number 5   Number of Visits 25   Date for PT Re-Evaluation 03/01/15   PT Start Time 0445   PT Stop Time 0530   PT Time Calculation (min) 45 min   Equipment Utilized During Treatment Gait belt   Activity Tolerance Patient tolerated treatment well   Behavior During Therapy [facility] for tasks assessed/performed      Past Medical History  Diagnosis Date  . Colorectal polyps     history of     Past Surgical History  Procedure Laterality Date  . Hernia repair    . Colonoscopy  02/27/2012    Procedure: COLONOSCOPY;  Surgeon: Rogene Houston, MD;  [facility];  Service: Endoscopy;  Laterality: N/A;  8:30    There were no vitals filed for this visit.  Visit Diagnosis:  Cognitive communication deficit  Difficulty walking      Subjective Assessment - 12/14/14 1650    Subjective Patient continues to havie dynamic and static standing balance deficits and will continue to benefit from skilled PT to improve falls risk and increase strength.    Patient is accompained by: Family member   Limitations Walking;Lifting;Standing   Patient Stated Goals improve balance and decrease dependance on AD       HEP Review:  Sit to stand x10 with use of hand rail Standing hip flexion yellow tband , x10 each leg  Standing hip Extension Yellow tband x10 each leg Standing hip abduction Yellow tband x10 each leg.  Standing in the corner with modified tandem standing and head turns left and right.   Verbal instruction for improve exercise technique and decreased use of compensatory movements. Instructed in getting  into a corner to work on modified tandem and head turns  Balance therapeutic exercises.  Leg press x 75 lbs 20 x 3 Ankle press x 745 lbs x 20 x 3  Standing feet together eyes open 30 seconds x2, eyes closed 30 seconds x2   Modified Tandem stance,  SLS Bilaterally.  Step up to 5 inch step, no HHA 15 each LE. Posterior  required use of hands on parallel bars to correct  Toe taps on 4 inch step, no HHA 15 each LE. Posterior loss of balance required use of hands on parallel bars to correct Ball toss small and large with loss of balance Gait training with head turns left and right , stops and fast ambulation without AD and loss of balance multiple times  For balance activities PT provided CGA for increased safety with LOB, verbal instruction and tactile cues to increase use of hip strategy with posterior LOB. Continues to have balance deficits typical with diagnosis. Patient performs intermediate level exercises without pain behaviors and needs verbal cuing for postural alignment and head positioning.                           PT Education - 12/14/14 1650    Education provided Yes   Education Details HEP   Person(s) Educated Patient   Methods Explanation   Comprehension Verbalized understanding  PT Long Term Goals - 11/29/14 1509    PT LONG TERM GOAL #1   Title Patient will increase 10 meter walk test to >1.78m/s as to improve gait speed for better community ambulation and to reduce fall risk  03-01-15   PT LONG TERM GOAL #2   Title Patient will increase Berg Balance score by > 6 points to demonstrate decreased fall risk during functional activities. 03-01-15   PT LONG TERM GOAL #3   Title Patient will reduce timed up and go to <11 seconds to reduce fall risk and demonstrate improved transfer/gait ability 03-01-15   PT LONG TERM GOAL #4   Title Patient will be independent in home exercise program to improve strength/mobility for better functional  independence with ADLs. 03-01-15   PT LONG TERM GOAL #5   Title Patient will tolerate 5 seconds of single leg stance without loss of balance to improve ability to get in and out of shower safely 03-01-15   PT LONG TERM GOAL #6   Title Patient will increase BLE gross strength to 4+/5 as to improve functional strength for independent gait, increased standing tolerance and increased ADL ability.Patient will increase BLE gross strength to 4+/5 as to improve functional strength for independent gait, increased standing tolerance and increased ADL ability.               Plan - 12/14/14 1651    Clinical Impression Statement Continues to have balance deficits typical with diagnosis. Patient performs intermediate level exercises without pain behaviors and needs verbal cuing for postural alignment and head positioning.   Pt will benefit from skilled therapeutic intervention in order to improve on the following deficits Abnormal gait;Decreased strength;Difficulty walking;Decreased mobility;Decreased balance;Impaired flexibility   Rehab Potential Good   PT Frequency 2x / week   PT Duration 8 weeks   PT Treatment/Interventions Therapeutic exercise;Therapeutic activities;Gait training;Balance training;Functional mobility training   PT Next Visit Plan gait training with SPC, continue balance exercises.    PT Home Exercise Plan see pt instructions   Consulted and Agree with Plan of Care Patient;Family member/caregiver        Problem List There are no active problems to display for this patient.   Alanson Puls 12/14/2014, 4:54 PM  [place] MAIN [facility] SERVICES [address] [place], [place], [address] Phone: [phone]   Fax:  [phone]

## 2014-12-15 ENCOUNTER — Encounter: Payer: BLUE CROSS/BLUE SHIELD | Admitting: Speech Pathology

## 2014-12-16 ENCOUNTER — Ambulatory Visit: Payer: BLUE CROSS/BLUE SHIELD | Admitting: Physical Therapy

## 2014-12-16 ENCOUNTER — Encounter: Payer: Self-pay | Admitting: Physical Therapy

## 2014-12-16 DIAGNOSIS — R262 Difficulty in walking, not elsewhere classified: Secondary | ICD-10-CM

## 2014-12-16 DIAGNOSIS — I6910 Unspecified sequelae of nontraumatic intracerebral hemorrhage: Secondary | ICD-10-CM | POA: Diagnosis not present

## 2014-12-16 NOTE — Therapy (Signed)
[place] MAIN [facility] SERVICES [address] [place], [place], [address] Phone: [phone]   Fax:  [phone]  Physical Therapy Treatment  Patient Details  Name: Robert Gillespie MRN: [id number] Date of Birth: December 25, 1950 Referring Provider:  Lemmie Evens, MD  Encounter Date: 12/16/2014      PT End of Session - 12/16/14 1003    Visit Number 6   Number of Visits 25   Date for PT Re-Evaluation 03/01/15   PT Start Time 0850   PT Stop Time 0935   PT Time Calculation (min) 45 min   Equipment Utilized During Treatment Gait belt   Activity Tolerance Patient tolerated treatment well   Behavior During Therapy [facility] for tasks assessed/performed      Past Medical History  Diagnosis Date  . Colorectal polyps     history of     Past Surgical History  Procedure Laterality Date  . Hernia repair    . Colonoscopy  02/27/2012    Procedure: COLONOSCOPY;  Surgeon: Rogene Houston, MD;  [facility];  Service: Endoscopy;  Laterality: N/A;  8:30    There were no vitals filed for this visit.  Visit Diagnosis:  Difficulty walking      Subjective Assessment - 12/16/14 0856    Subjective Pt states that he has been experiencing an upset stomach for the pst 2 weeks and he is having trouble keeping food down, he said that his MD has added a medication to help this problem but he cannot remember the name of the medication. Increased balance noted by patient since start of PT. No pain noted on this day, but patient states that he is having some numbness in the first 3 fingers on his L hand.    Patient is accompained by: Family member   Limitations Walking;Lifting;Standing   Patient Stated Goals improve balance and decrease dependance on AD    Currently in Pain? No/denies          Treatment:   5 minutes on nustep, level 3 (unbilled)   Gait training with  SPC. For 200 ft on tile floor CGA Gait training with SPC, CGA, on carpet with dual  task to look left and right and identify playing cards on wall 63ft  Gait training over obstacles(2-5 inch bolsters) 36ft x 10 Cues to   Pt ambulates with slight forwards head and decreased step length and cadence, difficulty with maintaining reciprocal gait pattern with [name] CGA provided for all SPC gait training to increase safety, PT needed to prevent LOB x1 with obstacle negotiation. Verbal and visual instruction to correct forward head posture, maintain reciprocal 2 point gait pattern, increase step length. Patient responded well to PT instructions; able to make adjustments and improve throughout the entire session.     SLS on firm surface 3x30 seconds each LE (able to maintain up to 12 seconds on L LE and 25 seconds on R LE)  SLS on airex 3x30 seconds each LE (able  To maintain up to 8 seconds on L LE and 15 seconds and R LE)  Toe taps on 2 stepping stones 2x10 each LE  Toe taps on 6 inch cone 2x10 each LE  PT provided CGA for all balance exercises. Verbal and tactile instruction to increase hip strategy with posterior LOB and cues to utilize stepping correction strategy to prevent lateral LOB.  PT Long Term Goals - 11/29/14 1509    PT LONG TERM GOAL #1   Title Patient will increase 10 meter walk test to >1.67m/s as to improve gait speed for better community ambulation and to reduce fall risk  03-01-15   PT LONG TERM GOAL #2   Title Patient will increase Berg Balance score by > 6 points to demonstrate decreased fall risk during functional activities. 03-01-15   PT LONG TERM GOAL #3   Title Patient will reduce timed up and go to <11 seconds to reduce fall risk and demonstrate improved transfer/gait ability 03-01-15   PT LONG TERM GOAL #4   Title Patient will be independent in home exercise program to improve strength/mobility for better functional independence with ADLs. 03-01-15   PT LONG TERM GOAL #5   Title Patient will tolerate 5 seconds  of single leg stance without loss of balance to improve ability to get in and out of shower safely 03-01-15   PT LONG TERM GOAL #6   Title Patient will increase BLE gross strength to 4+/5 as to improve functional strength for independent gait, increased standing tolerance and increased ADL ability.Patient will increase BLE gross strength to 4+/5 as to improve functional strength for independent gait, increased standing tolerance and increased ADL ability.               Plan - 12/16/14 1004    Clinical Impression Statement Patient performed gait training with SPC on this day on various surfaces. Patient required min-moderate verbal instruction to maintian reciprocal gait pattern with SPC, especially with dual tasking; patient responded well to verbal instruction. Double limb support balance appears to have improved but, patient continues to display deficits with single limb support L>R. Continued skilled PT is recommended to increase balance and improve gait to increase independence at home and within the community.    Pt will benefit from skilled therapeutic intervention in order to improve on the following deficits Abnormal gait;Decreased strength;Difficulty walking;Decreased mobility;Decreased balance;Impaired flexibility   Rehab Potential Good   PT Frequency 2x / week   PT Duration 8 weeks   PT Treatment/Interventions Therapeutic exercise;Therapeutic activities;Gait training;Balance training;Functional mobility training;Neuromuscular re-education   PT Next Visit Plan advance gait training with SPC, continue balance exercises.    PT Home Exercise Plan continue as given    Consulted and Agree with Plan of Care Patient;Family member/caregiver        Problem List There are no active problems to display for this patient.  Barrie Folk SPT 12/16/2014   4:56 PM  This entire session was performed under direct supervision and direction of a licensed therapist. I have personally read,  edited and approve of the note as written.  Hopkins,Margaret 12/16/2014, 4:56 PM  [place] MAIN [facility] SERVICES [address] [place], [place], [address] Phone: [phone]   Fax:  [phone]

## 2014-12-21 ENCOUNTER — Ambulatory Visit: Payer: BLUE CROSS/BLUE SHIELD | Admitting: Speech Pathology

## 2014-12-21 ENCOUNTER — Encounter: Payer: Self-pay | Admitting: Physical Therapy

## 2014-12-21 ENCOUNTER — Ambulatory Visit: Payer: BLUE CROSS/BLUE SHIELD | Admitting: Physical Therapy

## 2014-12-21 ENCOUNTER — Encounter: Payer: Self-pay | Admitting: Speech Pathology

## 2014-12-21 DIAGNOSIS — R262 Difficulty in walking, not elsewhere classified: Secondary | ICD-10-CM

## 2014-12-21 DIAGNOSIS — I6910 Unspecified sequelae of nontraumatic intracerebral hemorrhage: Secondary | ICD-10-CM | POA: Diagnosis not present

## 2014-12-21 DIAGNOSIS — R41841 Cognitive communication deficit: Secondary | ICD-10-CM

## 2014-12-21 NOTE — Therapy (Signed)
[place] MAIN [facility] SERVICES [address] [place], [place], [address] Phone: [phone]   Fax:  [phone]  Speech Language Pathology Treatment  Patient Details  Name: Robert Gillespie MRN: [id number] Date of Birth: [birth year] Referring Provider:  Lemmie Evens, MD  Encounter Date: 12/21/2014      End of Session - 12/21/14 1629    Visit Number 2   Number of Visits 25   Date for SLP Re-Evaluation 03/03/15   SLP Start Time 1100   SLP Stop Time  1155   SLP Time Calculation (min) 55 min   Activity Tolerance Patient tolerated treatment well      Past Medical History  Diagnosis Date  . Colorectal polyps     history of     Past Surgical History  Procedure Laterality Date  . Hernia repair    . Colonoscopy  02/27/2012    Procedure: COLONOSCOPY;  Surgeon: Robert Houston, MD;  [facility];  Service: Endoscopy;  Laterality: N/A;  8:30    There were no vitals filed for this visit.  Visit Diagnosis: Cognitive communication deficit      Subjective Assessment - 12/21/14 1629    Subjective Patient stated he has been going in to work 2-3 days per week and that it has been going well. 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   Patient is accompained by: Family member   Currently in Pain? No/denies               ADULT SLP TREATMENT - 12/21/14 1627    General Information   Behavior/Cognition Alert   Treatment Provided   Treatment provided Cognitive-Linquistic   Pain Assessment   Pain Assessment No/denies pain   Cognitive-Linquistic Treatment   Treatment focused on Cognition   Skilled Treatment Patient and his son discussed the impacts on his stroke on his performance of daily functions. Patient's son reports that his father needs additional processing time and help with spelling. Patient has been going to the office and performing some tasks such as  writing checks. His son has been helping with managing day-to-day needs at the office. STRATEGIES ASSESSMENT: Patient reproduced a geometric shape associated with a line drawing in with 75% accuracy. Patient recalled 6/7 words from a list. He recalled the additional word when given a positional cue. Patient recalled 2/5 names associated with a face. After instructed to use memory strategy of association, he was able to recall 5/5 names. WORD PROBLEMS: Patient solved 80% of word problems using basic arithmetic. With additional processing time and verbal and written cues, patient achieved 100% accuracy.   Assessment / Recommendations / Plan   Plan Continue with current plan of care   Progression Toward Goals   Progression toward goals Progressing toward goals          SLP Education - 12/21/14 1629    Education provided Yes   Education Details memory strategies   Person(s) Educated Patient   Methods Explanation;Demonstration;Verbal cues   Comprehension Verbalized understanding;Returned demonstration            SLP Long Term Goals - 12/09/14 1444    SLP LONG TERM GOAL #1   Title Patient will demonstrate functional cognitive-communication skills for successful return to work.    Time 12   Period Weeks   Status New   SLP LONG TERM GOAL #2   Title Patient will complete complex executive function  skills tasks with 80% accuracy.    Time 12   Period Weeks   Status New   SLP LONG TERM GOAL #3   Title Patient will complete complex attention tasks with 80% accuracy.    Time 12   Period Weeks   Status New   SLP LONG TERM GOAL #4   Title Patient will complete memory strategy activities with 80% accuracy.    Time 12   Period Weeks   Status New          Plan - 12/21/14 1630    Clinical Impression Statement [name] is able to complete word problems with additional processing time and occasionally requires verbal and image cues to break problems into smaller parts. He is able to  use memory strategies such as association when verbally prompted to do so. [name] will benefit from restorative and compensatory treatment for functional cognitive communication skills needed at work including attention, memory, executive function, language and visuospatial skills.   Speech Therapy Frequency 2x / week   Duration Other (comment)  12 weeks   Treatment/Interventions Compensatory techniques;Cueing hierarchy;Functional tasks;Internal/external aids;Cognitive reorganization;SLP instruction and feedback;Patient/family education   Potential to Achieve Goals Good   Potential Considerations Ability to learn/carryover information;Co-morbidities;Cooperation/participation level;Medical prognosis;Previous level of function;Severity of impairments;Family/community support   SLP Home Exercise Plan Patient to complete word problems at home. Patient to start keeping a log of work activities and a to do list.   Consulted and Agree with Plan of Care Patient;Family member/caregiver   Family Member Consulted son        Problem List There are no active problems to display for this patient.   Erma Pinto 12/21/2014, 4:37 PM  [place] MAIN [facility] SERVICES [address] [place], [place], [address] Phone: [phone]   Fax:  [phone]

## 2014-12-21 NOTE — Therapy (Signed)
[place] MAIN [facility] SERVICES [address] [place], [place], [address] Phone: [phone]   Fax:  [phone]  Physical Therapy Treatment  Patient Details  Name: Robert Gillespie MRN: [id number] Date of Birth: 1950/10/26 Referring Provider:  Lemmie Evens, MD  Encounter Date: 12/21/2014      PT End of Session - 12/21/14 1112    Visit Number 7   Number of Visits 25   Date for PT Re-Evaluation 03/01/15   PT Start Time 0958   PT Stop Time 1059   PT Time Calculation (min) 61 min   Equipment Utilized During Treatment Gait belt   Activity Tolerance Patient tolerated treatment well   Behavior During Therapy [facility] for tasks assessed/performed      Past Medical History  Diagnosis Date  . Colorectal polyps     history of     Past Surgical History  Procedure Laterality Date  . Hernia repair    . Colonoscopy  02/27/2012    Procedure: COLONOSCOPY;  Surgeon: Rogene Houston, MD;  [facility];  Service: Endoscopy;  Laterality: N/A;  8:30    There were no vitals filed for this visit.  Visit Diagnosis:  Difficulty walking      Subjective Assessment - 12/21/14 1002    Subjective Patient states that he is doing well on this day, but notes some nausea that she been persisting for the past 2 weeks with Slight improvement.    Patient is accompained by: Family member   Limitations Walking;Lifting;Standing   Patient Stated Goals improve balance and decrease dependence on AD              Gait training with SPC.   On carpet normal pace 426ft with dual task Change of pace slow fast normal 474ft  Side stepping R and L 2 posterior LOB with stepping to R and one posterior LOB with to the L. 70ft each direction  Normal speed with 180degree turns to R and L. 200 ft.   CGA With gait training. patient demonstrated adequate foot clearance for most of gait, occasional foot drag on R LE, inconsistent placement of SPC. PT provided cues for  erect posture, proper 2 point gait pattern  And cues for proper sequencing with SPC. Min A provided for one LOB, PT provided instruction for stepping strategy to correct, patient able to self correct for 2 additional loss of balance with stepping strategy and use of SPC    Balance exercise  tandem walk on airex beam with 10 second hold.1 lap X 2   side step on airex bean x 4 laps. Posterior LOB x3; hand rails to correct  SLS toe taps on 2 consecutive cones x10 each LE. Posterior LOB x3, hand rails to correct    CGA, verbal and tactile instruction to utilize hip strategy to correct posterior LOB, minimal adjustment from patient from cues. Cues also provided to decreased flexed posture and maintain straight gaze.   [place]atum leg press 75# x12, 90# x10  Calf raises on quatum press 75# 2x12.  3 way standing hip exercise, red tband (flexion, extension, abduction) x12 each LE for each motion    Verbal Cues for proper speed of exercise and decrease trunk compensation.          PT Education - 12/21/14 1111    Education provided Yes   Education Details balance exercise gait with SPC   Person(s) Educated Patient   Methods Explanation;Tactile cues;Demonstration;Verbal cues   Comprehension  Verbalized understanding;Returned demonstration             PT Long Term Goals - 11/29/14 1509    PT LONG TERM GOAL #1   Title Patient will increase 10 meter walk test to >1.46m/s as to improve gait speed for better community ambulation and to reduce fall risk  03-01-15   PT LONG TERM GOAL #2   Title Patient will increase Berg Balance score by > 6 points to demonstrate decreased fall risk during functional activities. 03-01-15   PT LONG TERM GOAL #3   Title Patient will reduce timed up and go to <11 seconds to reduce fall risk and demonstrate improved transfer/gait ability 03-01-15   PT LONG TERM GOAL #4   Title Patient will be independent in home exercise program to improve strength/mobility for better  functional independence with ADLs. 03-01-15   PT LONG TERM GOAL #5   Title Patient will tolerate 5 seconds of single leg stance without loss of balance to improve ability to get in and out of shower safely 03-01-15   PT LONG TERM GOAL #6   Title Patient will increase BLE gross strength to 4+/5 as to improve functional strength for independent gait, increased standing tolerance and increased ADL ability.Patient will increase BLE gross strength to 4+/5 as to improve functional strength for independent gait, increased standing tolerance and increased ADL ability.               Plan - 12/21/14 1113    Clinical Impression Statement Patient instructed in gait training, therapeutic exercise, and balance training on this day. Patient demonstrates improved mechanics with SPC on this day with adequate foot clearance for most of gait, some inconsistency noted with SPC placement, but improved from last session. Continued balance deficits noted, but patient's ability to self-correct without hands has improved with cues to utilize hip and stepping strategy to prevent posterior LOB. Continued skilled PT is recommended to improve balance and decrease use of AD to allow return to PLOF.     Pt will benefit from skilled therapeutic intervention in order to improve on the following deficits Abnormal gait;Decreased strength;Difficulty walking;Decreased mobility;Decreased balance;Impaired flexibility   Rehab Potential Good   PT Frequency 2x / week   PT Duration 8 weeks   PT Treatment/Interventions Therapeutic exercise;Therapeutic activities;Gait training;Balance training;Functional mobility training;Neuromuscular re-education   PT Next Visit Plan advance gait training with SPC, continue balance exercises.    PT Home Exercise Plan continue as given    Consulted and Agree with Plan of Care Patient;Family member/caregiver        Problem List There are no active problems to display for this patient.  Barrie Folk SPT 12/22/2014   5:44 PM  This entire session was performed under direct supervision and direction of a licensed therapist . I have personally read, edited and approve of the note as written.  Hopkins,Margaret 12/22/2014, 5:44 PM  [place] MAIN [facility] SERVICES [address] [place], [place], [address] Phone: [phone]   Fax:  [phone]

## 2014-12-22 ENCOUNTER — Encounter: Payer: Self-pay | Admitting: Physical Therapy

## 2014-12-24 ENCOUNTER — Encounter: Payer: Self-pay | Admitting: Physical Therapy

## 2014-12-24 ENCOUNTER — Ambulatory Visit: Payer: BLUE CROSS/BLUE SHIELD | Admitting: Speech Pathology

## 2014-12-24 ENCOUNTER — Ambulatory Visit: Payer: BLUE CROSS/BLUE SHIELD | Admitting: Physical Therapy

## 2014-12-24 ENCOUNTER — Encounter: Payer: Self-pay | Admitting: Speech Pathology

## 2014-12-24 DIAGNOSIS — R41841 Cognitive communication deficit: Secondary | ICD-10-CM

## 2014-12-24 DIAGNOSIS — R262 Difficulty in walking, not elsewhere classified: Secondary | ICD-10-CM

## 2014-12-24 DIAGNOSIS — I6910 Unspecified sequelae of nontraumatic intracerebral hemorrhage: Secondary | ICD-10-CM | POA: Diagnosis not present

## 2014-12-24 NOTE — Therapy (Signed)
[place] MAIN [facility] SERVICES [address] [place], [place], [address] Phone: [phone]   Fax:  [phone]  Speech Language Pathology Treatment  Patient Details  Name: Robert Gillespie MRN: [id number] Date of Birth: 07-22-1950 Referring Provider:  Hoy Finlay*  Encounter Date: 12/24/2014      End of Session - 12/24/14 1338    Visit Number 3   Number of Visits 25   Date for SLP Re-Evaluation 03/03/15   SLP Start Time 1000   SLP Stop Time  1055   SLP Time Calculation (min) 55 min   Activity Tolerance Patient tolerated treatment well      Past Medical History  Diagnosis Date  . Colorectal polyps     history of     Past Surgical History  Procedure Laterality Date  . Hernia repair    . Colonoscopy  02/27/2012    Procedure: COLONOSCOPY;  Surgeon: Rogene Houston, MD;  [facility];  Service: Endoscopy;  Laterality: N/A;  8:30    There were no vitals filed for this visit.  Visit Diagnosis: Cognitive communication deficit      Subjective Assessment - 12/24/14 1337    Subjective Patient said he went to work on Wednesday and Thursday.   Currently in Pain? No/denies               ADULT SLP TREATMENT - 12/24/14 1336    General Information   Behavior/Cognition Alert   Treatment Provided   Treatment provided Cognitive-Linquistic   Pain Assessment   Pain Assessment No/denies pain   Cognitive-Linquistic Treatment   Treatment focused on Cognition   Skilled Treatment Reviewed math language homework. Robert Gillespie achieved 92% accuracy. He was encouraged to revisit the questions he missed, and was able to find the correct answer with verbal and written support, and prompts to write down his process. He was educated about writing things down to overcome memory and attention deficits when solving multi-step problems. Organizing functional information: Robert Gillespie used a check register to answer 70% of functional  questions correctly. He was encouraged to verbalize his process when searching for information. 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 Patient reported he keeps track "in his head." Attention: Patient used a calculator to compute written equations with maximal support.   Assessment / Recommendations / Plan   Plan Continue with current plan of care   Progression Toward Goals   Progression toward goals Progressing toward goals          SLP Education - 12/24/14 1337    Education provided Yes   Education Details writing things down, using external aids   Person(s) Educated Patient   Methods Explanation;Verbal cues   Comprehension Verbalized understanding;Verbal cues required            SLP Long Term Goals - 12/09/14 1444    SLP LONG TERM GOAL #1   Title Patient will demonstrate functional cognitive-communication skills for successful return to work.    Time 12   Period Weeks   Status New   SLP LONG TERM GOAL #2   Title Patient will complete complex executive function skills tasks with 80% accuracy.    Time 12   Period Weeks   Status New   SLP LONG TERM GOAL #3   Title Patient will complete complex attention tasks with 80% accuracy.    Time 12   Period Weeks   Status New   SLP  LONG TERM GOAL #4   Title Patient will complete memory strategy activities with 80% accuracy.    Time 12   Period Weeks   Status New          Plan - 12/24/14 1338    Clinical Impression Statement Robert Gillespie is able to complete word problems with additional processing time. When information becomes more complicated, he misses details and requires verbal and image cues to break problems into smaller parts. He shows reduced awareness of the need to write things down in order to remember them. Robert Gillespie will benefit from restorative and compensatory treatment for functional cognitive communication skills needed at work including attention, memory, executive  function, language and visuospatial skills.   Speech Therapy Frequency 2x / week   Duration Other (comment)  12 weeks   Treatment/Interventions Compensatory techniques;Cueing hierarchy;Functional tasks;Internal/external aids;Cognitive reorganization;SLP instruction and feedback;Patient/family education   Potential to Achieve Goals Good   Potential Considerations Ability to learn/carryover information;Co-morbidities;Cooperation/participation level;Medical prognosis;Previous level of function;Severity of impairments;Family/community support   SLP Home Exercise Plan Patient to complete word problems at home. Patient to start keeping a log of work activities and a to do list.   Consulted and Agree with Plan of Care Patient        Problem List There are no active problems to display for this patient.   Erma Pinto 12/24/2014, 1:39 PM  [place] MAIN [facility] SERVICES [address] [place], [place], [address] Phone: [phone]   Fax:  [phone]

## 2014-12-24 NOTE — Therapy (Signed)
[place] MAIN [facility] SERVICES [address] [place], [place], [address] Phone: [phone]   Fax:  [phone]  Physical Therapy Treatment/Progress note                 From 11/29/14-12/24/14 Patient Details  Name: Robert Gillespie MRN: [id number] Date of Birth: 17-Jan-1951 Referring Provider:  Hoy Finlay*  Encounter Date: 12/24/2014      PT End of Session - 12/24/14 1008    Visit Number 8   Number of Visits 25   Date for PT Re-Evaluation 03/01/15   PT Start Time 0910   PT Stop Time 0956   PT Time Calculation (min) 46 min   Equipment Utilized During Treatment Gait belt   Activity Tolerance Patient tolerated treatment well   Behavior During Therapy [facility] for tasks assessed/performed      Past Medical History  Diagnosis Date  . Colorectal polyps     history of     Past Surgical History  Procedure Laterality Date  . Hernia repair    . Colonoscopy  02/27/2012    Procedure: COLONOSCOPY;  Surgeon: Rogene Houston, MD;  [facility];  Service: Endoscopy;  Laterality: N/A;  8:30    There were no vitals filed for this visit.  Visit Diagnosis:  Difficulty walking      Subjective Assessment - 12/24/14 0921    Subjective Patient is still having trouble with nausea. He was vomitting at start of treatment session. Patient denies any pain. He reports that his walking has been going "pretty good"; He denies any new falls.   Patient is accompained by: Family member   Limitations Walking;Lifting;Standing   Patient Stated Goals improve balance and decrease dependence on AD    Currently in Pain? No/denies            [facility] PT Assessment - 12/24/14 0922    Sit to Stand   Comments 5 times sit<>stand 30 sec without HHA, (>15 sec indicates increased risk for falls; improved as compared to eval which was 36.5 sec pushing with 2 HHA   Standardized Balance Assessment   10 Meter Walk with SPC: slow pace: 0.58 m/s, fast pace:  0.77 m/s (increased risk for falls, limited ambulator; improved from last reassessment as patient able to complete with SPC; eval was 0.83 with RW; today with RW patient completed 10 meter walk at 1.2 m/s    Berg Balance Test   Sit to Stand Able to stand without using hands and stabilize independently   Standing Unsupported Able to stand safely 2 minutes   Sitting with Back Unsupported but Feet Supported on Floor or Stool Able to sit safely and securely 2 minutes   Stand to Sit Sits safely with minimal use of hands   Transfers Able to transfer safely, minor use of hands   Standing Unsupported with Eyes Closed Able to stand 10 seconds safely   Standing Ubsupported with Feet Together Able to place feet together independently and stand 1 minute safely   From Standing, Reach Forward with Outstretched Arm Can reach confidently >25 cm (10")   From Standing Position, Pick up Object from Floor Able to pick up shoe, needs supervision   From Standing Position, Turn to Look Behind Over each Shoulder Looks behind from both sides and weight shifts well   Turn 360 Degrees Able to turn 360 degrees safely but slowly   Standing Unsupported, Alternately Place Feet on Step/Stool Able to stand independently and  safely and complete 8 steps in 20 seconds   Standing Unsupported, One Foot in [place] to take small step independently and hold 30 seconds   Standing on One Leg Tries to lift leg/unable to hold 3 seconds but remains standing independently   Total Score 48 (improved from eval which was 35/56)   Timed Up and Go Test   Normal TUG (seconds) 15   TUG Comments with SPC; increased risk for falls; however improved from eval which was 24.5 sec;      TREATMENT: Warm up on Nustep level 2 BLE only x4 min (unbilled);  PT instructed patient in 10 meter walk, 5 times sit<>Stand, Berg Balance assessment and Timed up and go. Please see above.  PT also instructed patient in gait training on even/uneven  surface: Approximately 800 feet (walking on carpet, walking outside on pavement which was uneven with incline/decline and turns); Pt ambulated with SPC, requiring close supervision and min VCs for correct cane use for 2 point gait pattern. Patient exhibits reciprocal gait patten but does occasional exhibit veering side/side with increased lateral instability requiring close supervision-CGA for safety,especially with turns. Instructed patient to get a [facility] for home use,but to continue using RW when outside of home for safety;                         PT Education - 12/24/14 1006    Education provided Yes   Education Details plan of care, gait training   Person(s) Educated Patient   Methods Explanation;Verbal cues   Comprehension Verbalized understanding;Returned demonstration;Verbal cues required             PT Long Term Goals - 12/24/14 2248    PT LONG TERM GOAL #1   Title Patient will increase 10 meter walk test to >1.29ms as to improve gait speed for better community ambulation and to reduce fall risk  03-01-15   Status Partially Met   PT LONG TERM GOAL #2   Title Patient will increase Berg Balance score by > 6 points to demonstrate decreased fall risk during functional activities. 03-01-15   Status Achieved   PT LONG TERM GOAL #3   Title Patient will reduce timed up and go to <11 seconds to reduce fall risk and demonstrate improved transfer/gait ability 03-01-15   Status Partially Met   PT LONG TERM GOAL #4   Title Patient will be independent in home exercise program to improve strength/mobility for better functional independence with ADLs. 03-01-15   Status On-going   PT LONG TERM GOAL #5   Title Patient will tolerate 5 seconds of single leg stance without loss of balance to improve ability to get in and out of shower safely 03-01-15   Status Partially Met   PT LONG TERM GOAL #6   Title Patient will increase BLE gross strength to 4+/5 as to improve functional  strength for independent gait, increased standing tolerance and increased ADL ability.Patient will increase BLE gross strength to 4+/5 as to improve functional strength for independent gait, increased standing tolerance and increased ADL ability.   Status On-going               Plan - 12/24/14 1009    Clinical Impression Statement PT assessed patient's progress towards goals. Please see attached objective measures. Overall patient demonstrates significant improvement in gait and balance. He is able to walk with a SPC with good reciprocal gait pattern with close supervision. Occasionally he will loose  his coordination with the cane, but is able to stop and re-correct for better 2 point gait pattern. PT recommended that patient look into getting a SPC to use when inside the home, but to continue to use the RW when outside the home. He would benefit from additional skilled PT intervention to improve balance/gait safety and reduce fall risk.    Pt will benefit from skilled therapeutic intervention in order to improve on the following deficits Abnormal gait;Decreased strength;Difficulty walking;Decreased mobility;Decreased balance;Impaired flexibility   Rehab Potential Good   PT Frequency 2x / week   PT Duration 8 weeks   PT Treatment/Interventions Therapeutic exercise;Therapeutic activities;Gait training;Balance training;Functional mobility training;Neuromuscular re-education   PT Next Visit Plan advance gait training with SPC, continue balance exercises.    PT Home Exercise Plan continue as given    Consulted and Agree with Plan of Care Patient;Family member/caregiver        Problem List There are no active problems to display for this patient.   Hopkins,Margaret, PT, DPT 12/24/2014, 10:48 AM  [place] MAIN [facility] SERVICES [address] [place], [place], [address] Phone: [phone]   Fax:  [phone]

## 2014-12-28 ENCOUNTER — Ambulatory Visit: Payer: BLUE CROSS/BLUE SHIELD | Admitting: Physical Therapy

## 2014-12-28 ENCOUNTER — Ambulatory Visit: Payer: BLUE CROSS/BLUE SHIELD | Admitting: Speech Pathology

## 2014-12-28 ENCOUNTER — Encounter: Payer: Self-pay | Admitting: Physical Therapy

## 2014-12-28 ENCOUNTER — Encounter: Payer: Self-pay | Admitting: Speech Pathology

## 2014-12-28 DIAGNOSIS — R262 Difficulty in walking, not elsewhere classified: Secondary | ICD-10-CM

## 2014-12-28 DIAGNOSIS — R41841 Cognitive communication deficit: Secondary | ICD-10-CM

## 2014-12-28 DIAGNOSIS — I6910 Unspecified sequelae of nontraumatic intracerebral hemorrhage: Secondary | ICD-10-CM | POA: Diagnosis not present

## 2014-12-28 NOTE — Therapy (Signed)
[place] MAIN [facility] SERVICES [address] [place], [place], [address] Phone: [phone]   Fax:  [phone]  Physical Therapy Treatment  Patient Details  Name: Robert Gillespie MRN: [id number] Date of Birth: Dec 21, 1950 Referring Provider:  Hoy Finlay*  Encounter Date: 12/28/2014      PT End of Session - 12/28/14 1207    Visit Number 9   Number of Visits 25   Date for PT Re-Evaluation 03/01/15   PT Start Time 1005   PT Stop Time 1100   PT Time Calculation (min) 55 min   Equipment Utilized During Treatment Gait belt   Activity Tolerance Patient tolerated treatment well   Behavior During Therapy [facility] for tasks assessed/performed      Past Medical History  Diagnosis Date  . Colorectal polyps     history of     Past Surgical History  Procedure Laterality Date  . Hernia repair    . Colonoscopy  02/27/2012    Procedure: COLONOSCOPY;  Surgeon: Rogene Houston, MD;  [facility];  Service: Endoscopy;  Laterality: N/A;  8:30    There were no vitals filed for this visit.  Visit Diagnosis:  Difficulty walking      Subjective Assessment - 12/28/14 1013    Subjective Patient reports that he is still having trouble with nausea and upset stomach. States that he was unable to get a [facility] for home use over the weeekend, but states that he will be getting one either today or tomorrow.    Patient is accompained by: Family member   Limitations Walking;Lifting;Standing   Patient Stated Goals improve balance and decrease dependence on AD    Currently in Pain? No/denies       Treatment   Nustep Level 3, 4 minutes, LE only (unbilled)   Mini lunges x10 each LE  Side stepping 20 ft x5 laps  Side step on airex balance beam x4 laps.   CGA , moderate verbal instruction to increase ankle  Strategy to correct posterior LOB, cues to maintain erect posture and proper use of UE for support with lunges.    Sit to stand with  ball raise. x8   seated therex   Hip abduction red tband x12  Hip flexion red tband x12 each LE.   Min verbal instruction to decrease speed of movement and increase hold time at end range.   Stair training x 3 with 1 5HHA with step over step pattern with ascent and descent  Stair training x2 with no HHA with step over step with ascent and step to step with descent. LOB x2   Gait training in hall 150 ft x 4 normal pace. Cues for proper SPC placement .  Variable speed gait training. 150 ftx 2  Backward walking 22ft and 30 ft   CGA-MinA to prevent LOB On stairs and with gait training. Moderate verbal instruction to increase step length, correct SPC placement, utilize step to gait pattern with stair descent.                        PT Education - 12/28/14 1205    Education provided Yes   Education Details gait training with SPC. balance exercises.    Person(s) Educated Patient   Methods Explanation;Demonstration;Tactile cues;Verbal cues   Comprehension Verbalized understanding;Returned demonstration;Verbal cues required;Tactile cues required             PT Long Term Goals - 12/24/14  0922    PT LONG TERM GOAL #1   Title Patient will increase 10 meter walk test to >1.82m/s as to improve gait speed for better community ambulation and to reduce fall risk  03-01-15   Status Partially Met   PT LONG TERM GOAL #2   Title Patient will increase Berg Balance score by > 6 points to demonstrate decreased fall risk during functional activities. 03-01-15   Status Achieved   PT LONG TERM GOAL #3   Title Patient will reduce timed up and go to <11 seconds to reduce fall risk and demonstrate improved transfer/gait ability 03-01-15   Status Partially Met   PT LONG TERM GOAL #4   Title Patient will be independent in home exercise program to improve strength/mobility for better functional independence with ADLs. 03-01-15   Status On-going   PT LONG TERM GOAL #5   Title Patient will  tolerate 5 seconds of single leg stance without loss of balance to improve ability to get in and out of shower safely 03-01-15   Status Partially Met   PT LONG TERM GOAL #6   Title Patient will increase BLE gross strength to 4+/5 as to improve functional strength for independent gait, increased standing tolerance and increased ADL ability.Patient will increase BLE gross strength to 4+/5 as to improve functional strength for independent gait, increased standing tolerance and increased ADL ability.   Status On-going               Plan - 12/28/14 1207    Clinical Impression Statement Patient instructed in balance training, gait/stair training and LE strengthening on this day. CGA and moderate verbal instruction for gait training and balance exercises on this day. Patient demonstrated 2 LOB with backward gait training with SPC, LOB with forward gait training, and 2 LOB with stair descent with Hand rail assist; PT needed to correct LOB on stairs. Continued skilled PT recommended to increase strength, improve gait, and balance to return patient to PLOF.     Pt will benefit from skilled therapeutic intervention in order to improve on the following deficits Abnormal gait;Decreased strength;Difficulty walking;Decreased mobility;Decreased balance;Impaired flexibility   Rehab Potential Good   PT Frequency 2x / week   PT Duration 8 weeks   PT Treatment/Interventions Therapeutic exercise;Therapeutic activities;Gait training;Balance training;Functional mobility training;Neuromuscular re-education   PT Next Visit Plan Dynamic balance exercise, advance gait training with SPC,    PT Home Exercise Plan continue as given    Consulted and Agree with Plan of Care Patient;Family member/caregiver        Problem List There are no active problems to display for this patient. Barrie Folk, SPT This entire session was performed under direct supervision and direction of a licensed therapist. I have personally  read, edited and approve of the note as written.   Hopkins,Margaret 12/28/2014, 2:31 PM  [place] MAIN [facility] SERVICES [address] [place], [place], [address] Phone: [phone]   Fax:  [phone]

## 2014-12-28 NOTE — Therapy (Signed)
[place] MAIN [facility] SERVICES [address] [place], [place], [address] Phone: [phone]   Fax:  [phone]  Speech Language Pathology Treatment  Patient Details  Name: Robert Gillespie MRN: [id number] Date of Birth: 08/26/1950 Referring Provider:  Hoy Finlay*  Encounter Date: 12/28/2014      End of Session - 12/28/14 1718    Visit Number 4   Number of Visits 25   Date for SLP Re-Evaluation 03/03/15   SLP Start Time 1105   SLP Stop Time  1155   SLP Time Calculation (min) 50 min   Activity Tolerance Patient tolerated treatment well      Past Medical History  Diagnosis Date  . Colorectal polyps     history of     Past Surgical History  Procedure Laterality Date  . Hernia repair    . Colonoscopy  02/27/2012    Procedure: COLONOSCOPY;  Surgeon: Rogene Houston, MD;  [facility];  Service: Endoscopy;  Laterality: N/A;  8:30    There were no vitals filed for this visit.  Visit Diagnosis: Cognitive communication deficit      Subjective Assessment - 12/28/14 1716    Subjective Patient said he completed his homework using his calculator at home, but that he had forgotten how to use it. He and his son may purchase a new one soon.   Patient is accompained by: Family member   Currently in Pain? No/denies               ADULT SLP TREATMENT - 12/28/14 1716    General Information   Behavior/Cognition Alert   Treatment Provided   Treatment provided Cognitive-Linquistic   Pain Assessment   Pain Assessment No/denies pain   Cognitive-Linquistic Treatment   Treatment focused on Cognition   Skilled Treatment Sustained attention, compensatory strategy training: Patient read instructions and wrote answers to questions about the instructions. Patient achieved 75% accuracy with maximal support: verbal and visual cues to read aloud, underline important information, break information into smaller parts, visualize  and associate information.   Assessment / Recommendations / Plan   Plan Continue with current plan of care   Progression Toward Goals   Progression toward goals Progressing toward goals          SLP Education - 12/28/14 1717    Education provided Yes   Education Details compensatory strategies for attention and memory.   Person(s) Educated Patient;Child(ren)   Methods Explanation;Demonstration;Verbal cues   Comprehension Verbal cues required;Verbalized understanding            SLP Long Term Goals - 12/09/14 1444    SLP LONG TERM GOAL #1   Title Patient will demonstrate functional cognitive-communication skills for successful return to work.    Time 12   Period Weeks   Status New   SLP LONG TERM GOAL #2   Title Patient will complete complex executive function skills tasks with 80% accuracy.    Time 12   Period Weeks   Status New   SLP LONG TERM GOAL #3   Title Patient will complete complex attention tasks with 80% accuracy.    Time 12   Period Weeks   Status New   SLP LONG TERM GOAL #4   Title Patient will complete memory strategy activities with 80% accuracy.    Time 12   Period Weeks   Status New          Plan - 12/28/14 1718  Clinical Impression Statement Mr. Cothran requires additional processing time as well as prompts to initiate use of compensatory strategies. He shows reduced awareness of the need to use strategies when presented with tasks requiring attention and mental flexibility. Mr. Difatta will benefit from skilled intervention to support his ability to recognize the appropriate times and types of strategies and aids to use to help him accomplish functional tasks.   Speech Therapy Frequency 2x / week   Duration Other (comment)  12 weeks   Treatment/Interventions Compensatory techniques;Cueing hierarchy;Functional tasks;Internal/external aids;Cognitive reorganization;SLP instruction and feedback;Patient/family education   Potential to Achieve Goals  Good   Potential Considerations Ability to learn/carryover information;Co-morbidities;Cooperation/participation level;Medical prognosis;Previous level of function;Severity of impairments;Family/community support   SLP Home Exercise Plan Patient to practice calculator use with math problems.   Consulted and Agree with Plan of Care Patient;Family member/caregiver   Family Member Consulted son        Problem List There are no active problems to display for this patient.   Erma Pinto 12/28/2014, 5:19 PM  [place] MAIN [facility] SERVICES [address] [place], [place], [address] Phone: [phone]   Fax:  [phone]

## 2014-12-30 ENCOUNTER — Other Ambulatory Visit: Payer: Self-pay | Admitting: Neurosurgery

## 2014-12-30 DIAGNOSIS — Q282 Arteriovenous malformation of cerebral vessels: Secondary | ICD-10-CM

## 2014-12-31 ENCOUNTER — Ambulatory Visit: Payer: BLUE CROSS/BLUE SHIELD | Admitting: Physical Therapy

## 2014-12-31 ENCOUNTER — Encounter: Payer: Self-pay | Admitting: Physical Therapy

## 2014-12-31 ENCOUNTER — Encounter: Payer: BLUE CROSS/BLUE SHIELD | Admitting: Speech Pathology

## 2014-12-31 ENCOUNTER — Ambulatory Visit: Payer: BLUE CROSS/BLUE SHIELD | Admitting: Speech Pathology

## 2014-12-31 ENCOUNTER — Encounter: Payer: Self-pay | Admitting: Speech Pathology

## 2014-12-31 DIAGNOSIS — I6910 Unspecified sequelae of nontraumatic intracerebral hemorrhage: Secondary | ICD-10-CM | POA: Diagnosis not present

## 2014-12-31 DIAGNOSIS — R262 Difficulty in walking, not elsewhere classified: Secondary | ICD-10-CM

## 2014-12-31 DIAGNOSIS — R41841 Cognitive communication deficit: Secondary | ICD-10-CM

## 2014-12-31 NOTE — Therapy (Signed)
[place] MAIN [facility] SERVICES [address] [place], [place], [address] Phone: [phone]   Fax:  [phone]  Physical Therapy Treatment  Patient Details  Name: Robert Gillespie MRN: [id number] Date of Birth: [birth year] Referring Provider:  Hoy Finlay*  Encounter Date: 12/31/2014      PT End of Session - 12/31/14 1228    Visit Number 10   Number of Visits 25   Date for PT Re-Evaluation 03/01/15   PT Start Time 1100   PT Stop Time 1200   PT Time Calculation (min) 60 min   Equipment Utilized During Treatment Gait belt   Activity Tolerance Patient tolerated treatment well   Behavior During Therapy [facility] for tasks assessed/performed      Past Medical History  Diagnosis Date  . Colorectal polyps     history of     Past Surgical History  Procedure Laterality Date  . Hernia repair    . Colonoscopy  02/27/2012    Procedure: COLONOSCOPY;  Surgeon: Rogene Houston, MD;  [facility];  Service: Endoscopy;  Laterality: N/A;  8:30    There were no vitals filed for this visit.  Visit Diagnosis:  Difficulty walking      Subjective Assessment - 12/31/14 1100    Subjective Patient states that he feels his balance is "a little off" on this day, and some nausea noted. Reports that he had a visit with the Neurologist and states that he may need to have spinal tap to reduce "pressure" in order to reduce nausea. Patient states this procedure will take place some time next week. Gait within the home still with mainly RW, but patient has purchased SPC.   Patient is accompained by: Family member   Limitations Walking;Lifting;Standing   Patient Stated Goals improve balance and decrease dependence on AD    Currently in Pain? No/denies         Treatment  Nustep level 4, 5 minutes. (unbilled)   Step up to 6 inch box x10 each LE 1 posterior LOB, patient able to self correct  lunge stance on dyna-disc 2x30 second hold  Weight  shift in lunge stance on dyna-disk x15 each direction  Rocker board 2x 20 posterior/anterior rocks no HHA x3 LOB  SLS on airex mat x 20 seconds each LE. (up to 10 seconds on R LE and 15 seconds on L LE)     Toe taps on 6 inch cone, standing on airex x 10 each LE.   CGA Verbal instruction for increased hip strategy to reduce posterior LOB, increase erect posture, and reduced use of UE with LOB when possible.    Gait training with SPC, CGA provided throughout gait training.  Compliant surface 6 ft x10  Normal pace x370ft  Variable speed x 150 ft  Side step 79ftx4  Backward x40 ft, one posterior LOB, MinA from PT to correct.  With head turns x38ft x3, one LOB, patient able to correct    Patient demonstrates improved consistency with 2 point gait pattern. Reduced episodes LOB of with gait training on this day. Decrease step length noted with posterior gait and decreased step length on R LE with forward gait. Significant decrease in speed noted with head turns. PT provided verbal instruction to facilitate increased step length, correct 2 point gait pattern as needed, and increase speed with head turns and gait. Patient responded well to instruction on this day.  PT Education - 12/31/14 1228    Education provided Yes   Education Details Balance training, gait training   Person(s) Educated Patient   Methods Explanation;Demonstration;Verbal cues;Tactile cues   Comprehension Verbalized understanding;Returned demonstration;Verbal cues required             PT Long Term Goals - 12/24/14 0600    PT LONG TERM GOAL #1   Title Patient will increase 10 meter walk test to >1.64m/s as to improve gait speed for better community ambulation and to reduce fall risk  03-01-15   Status Partially Met   PT LONG TERM GOAL #2   Title Patient will increase Berg Balance score by > 6 points to demonstrate decreased fall risk during functional activities. 03-01-15   Status Achieved    PT LONG TERM GOAL #3   Title Patient will reduce timed up and go to <11 seconds to reduce fall risk and demonstrate improved transfer/gait ability 03-01-15   Status Partially Met   PT LONG TERM GOAL #4   Title Patient will be independent in home exercise program to improve strength/mobility for better functional independence with ADLs. 03-01-15   Status On-going   PT LONG TERM GOAL #5   Title Patient will tolerate 5 seconds of single leg stance without loss of balance to improve ability to get in and out of shower safely 03-01-15   Status Partially Met   PT LONG TERM GOAL #6   Title Patient will increase BLE gross strength to 4+/5 as to improve functional strength for independent gait, increased standing tolerance and increased ADL ability.Patient will increase BLE gross strength to 4+/5 as to improve functional strength for independent gait, increased standing tolerance and increased ADL ability.   Status On-going               Plan - 12/31/14 1229    Clinical Impression Statement Patient instructed in balance training and gait training on this day. CGA, verbal and tactile instruction utilized to improve posture and hip strategy with balance; patient responded well to instruction and demonstrates decreased use of hands to prevent posterior LOB compared to previous sessions. Improved mechanics noted with 2 point gait pattern with SPC on this day, but difficulty noted with posterior gait and gait with head turns, Patient responded well to verbal instruction when needed to correct step length and speed as well as stepping strategy to prevent LOB with gait. Continued skilled PT is recommended to facilitate improved function with gait, balance and strength in order to return patient to PLOF.   Pt will benefit from skilled therapeutic intervention in order to improve on the following deficits Abnormal gait;Decreased strength;Difficulty walking;Decreased mobility;Decreased balance;Impaired flexibility    Rehab Potential Good   PT Frequency 2x / week   PT Duration 8 weeks   PT Treatment/Interventions Therapeutic exercise;Therapeutic activities;Gait training;Balance training;Functional mobility training;Neuromuscular re-education   PT Next Visit Plan Dynamic balance exercise, continue gait training with SPC, advance HEP   PT Home Exercise Plan continue as given    Consulted and Agree with Plan of Care Patient;Family member/caregiver        Problem List There are no active problems to display for this patient.  Barrie Folk SPT 12/31/2014   12:48 PM   Barrie Folk 12/31/2014, 12:48 PM  [place] MAIN [facility] SERVICES [address] [place], [place], [address] Phone: [phone]   Fax:  [phone]

## 2014-12-31 NOTE — Therapy (Signed)
[place] MAIN [facility] SERVICES [address] [place], [place], [address] Phone: [phone]   Fax:  [phone]  Speech Language Pathology Treatment  Patient Details  Name: Robert Gillespie MRN: [id number] Date of Birth: 08/10/1950 Referring Provider:  Hoy Finlay*  Encounter Date: 12/31/2014      End of Session - 12/31/14 1457    Visit Number 5   Number of Visits 25   Date for SLP Re-Evaluation 03/03/15   SLP Start Time 1000   SLP Stop Time  1055   SLP Time Calculation (min) 55 min   Activity Tolerance Patient tolerated treatment well      Past Medical History  Diagnosis Date  . Colorectal polyps     history of     Past Surgical History  Procedure Laterality Date  . Hernia repair    . Colonoscopy  02/27/2012    Procedure: COLONOSCOPY;  Surgeon: Rogene Houston, MD;  [facility];  Service: Endoscopy;  Laterality: N/A;  8:30    There were no vitals filed for this visit.  Visit Diagnosis: Cognitive communication deficit      Subjective Assessment - 12/31/14 1456    Subjective Patient reports he has not been very busy at work this week.   Currently in Pain? No/denies               ADULT SLP TREATMENT - 12/31/14 1456    General Information   Behavior/Cognition Alert   Treatment Provided   Treatment provided Cognitive-Linquistic   Pain Assessment   Pain Assessment No/denies pain   Cognitive-Linquistic Treatment   Treatment focused on Cognition   Skilled Treatment Calendar use, sustained attention, memory: Patient used a calendar to answer written questions. Patient was educated in use of strategies such as repetition, reading aloud, and underlining key information to improve performance. Patient used strategies when prompted verbally, and initiated use of strategies independently in 20% of opportunities. FUNCTIONAL SKILLS: Patient was given written information of strategies to improve/compensate  for memory performance, attention, and executive function. Discussed opportunities for patient to implement strategies in his workplace.   Assessment / Recommendations / Plan   Plan Continue with current plan of care   Progression Toward Goals   Progression toward goals Progressing toward goals          SLP Education - 12/31/14 1457    Education provided Yes   Education Details Memory, attention, executive function compensatory strategies   Person(s) Educated Patient   Methods Explanation;Demonstration;Verbal cues   Comprehension Verbalized understanding;Returned demonstration;Verbal cues required            SLP Long Term Goals - 12/09/14 1444    SLP LONG TERM GOAL #1   Title Patient will demonstrate functional cognitive-communication skills for successful return to work.    Time 12   Period Weeks   Status New   SLP LONG TERM GOAL #2   Title Patient will complete complex executive function skills tasks with 80% accuracy.    Time 12   Period Weeks   Status New   SLP LONG TERM GOAL #3   Title Patient will complete complex attention tasks with 80% accuracy.    Time 12   Period Weeks   Status New   SLP LONG TERM GOAL #4   Title Patient will complete memory strategy activities with 80% accuracy.    Time 12   Period Weeks   Status New  Plan - 12/31/14 1457    Clinical Impression Statement Mr. Korb requires additional processing time as well as prompts to initiate use of compensatory strategies. He shows reduced awareness of the need to use strategies when presented with tasks requiring attention and mental flexibility. Mr. Shaff will benefit from skilled intervention to support his ability to recognize the appropriate times and types of strategies and aids to use to help him accomplish functional tasks.   Speech Therapy Frequency 2x / week   Duration Other (comment)   Treatment/Interventions Compensatory techniques;Cueing hierarchy;Functional  tasks;Internal/external aids;Cognitive reorganization;SLP instruction and feedback;Patient/family education   Potential to Achieve Goals Good   Potential Considerations Ability to learn/carryover information;Co-morbidities;Cooperation/participation level;Medical prognosis;Previous level of function;Severity of impairments;Family/community support   SLP Home Exercise Plan Patient to complete calendar worksheet while using compensatory strategies practiced   Consulted and Agree with Plan of Care Patient        Problem List There are no active problems to display for this patient.   Erma Pinto 12/31/2014, 2:58 PM  [place] MAIN [facility] SERVICES [address] [place], [place], [address] Phone: [phone]   Fax:  [phone]

## 2015-01-04 ENCOUNTER — Ambulatory Visit: Payer: BLUE CROSS/BLUE SHIELD | Admitting: Physical Therapy

## 2015-01-04 ENCOUNTER — Ambulatory Visit
Admission: RE | Admit: 2015-01-04 | Discharge: 2015-01-04 | Disposition: A | Discharge status: Home / Self Care | Payer: BLUE CROSS/BLUE SHIELD | Source: Ambulatory Visit | Attending: Neurosurgery | Admitting: Neurosurgery

## 2015-01-04 DIAGNOSIS — Q282 Arteriovenous malformation of cerebral vessels: Secondary | ICD-10-CM

## 2015-01-04 DIAGNOSIS — G9619 Other disorders of meninges, not elsewhere classified: Secondary | ICD-10-CM | POA: Diagnosis not present

## 2015-01-06 ENCOUNTER — Ambulatory Visit: Payer: BLUE CROSS/BLUE SHIELD | Admitting: Physical Therapy

## 2015-01-06 ENCOUNTER — Encounter: Payer: BLUE CROSS/BLUE SHIELD | Admitting: Speech Pathology

## 2015-01-07 ENCOUNTER — Ambulatory Visit: Payer: BLUE CROSS/BLUE SHIELD | Admitting: Speech Pathology

## 2015-01-07 ENCOUNTER — Ambulatory Visit: Payer: BLUE CROSS/BLUE SHIELD | Admitting: Physical Therapy

## 2015-01-11 ENCOUNTER — Ambulatory Visit: Payer: BLUE CROSS/BLUE SHIELD | Admitting: Speech Pathology

## 2015-01-11 ENCOUNTER — Ambulatory Visit: Payer: BLUE CROSS/BLUE SHIELD | Admitting: Physical Therapy

## 2015-01-13 ENCOUNTER — Encounter: Payer: BLUE CROSS/BLUE SHIELD | Admitting: Speech Pathology

## 2015-01-13 ENCOUNTER — Ambulatory Visit: Payer: BLUE CROSS/BLUE SHIELD

## 2015-01-14 ENCOUNTER — Ambulatory Visit: Payer: BLUE CROSS/BLUE SHIELD | Admitting: Physical Therapy

## 2015-01-14 ENCOUNTER — Encounter: Payer: BLUE CROSS/BLUE SHIELD | Admitting: Speech Pathology

## 2015-01-17 ENCOUNTER — Encounter: Payer: BLUE CROSS/BLUE SHIELD | Admitting: Speech Pathology

## 2015-01-17 ENCOUNTER — Ambulatory Visit: Payer: BLUE CROSS/BLUE SHIELD | Admitting: Physical Therapy

## 2015-01-21 ENCOUNTER — Encounter: Payer: BLUE CROSS/BLUE SHIELD | Admitting: Speech Pathology

## 2015-01-21 ENCOUNTER — Ambulatory Visit: Payer: BLUE CROSS/BLUE SHIELD | Admitting: Physical Therapy

## 2015-01-25 ENCOUNTER — Ambulatory Visit: Payer: BLUE CROSS/BLUE SHIELD | Admitting: Physical Therapy

## 2015-01-25 ENCOUNTER — Encounter: Payer: BLUE CROSS/BLUE SHIELD | Admitting: Speech Pathology

## 2015-01-28 ENCOUNTER — Ambulatory Visit: Payer: BLUE CROSS/BLUE SHIELD | Admitting: Physical Therapy

## 2015-01-28 ENCOUNTER — Encounter: Payer: BLUE CROSS/BLUE SHIELD | Admitting: Speech Pathology

## 2015-01-31 ENCOUNTER — Ambulatory Visit: Payer: BLUE CROSS/BLUE SHIELD | Admitting: Physical Therapy

## 2015-01-31 ENCOUNTER — Encounter: Payer: BLUE CROSS/BLUE SHIELD | Admitting: Speech Pathology

## 2015-02-04 ENCOUNTER — Encounter: Payer: BLUE CROSS/BLUE SHIELD | Admitting: Speech Pathology

## 2015-02-04 ENCOUNTER — Ambulatory Visit: Payer: BLUE CROSS/BLUE SHIELD | Admitting: Physical Therapy

## 2015-02-09 ENCOUNTER — Ambulatory Visit: Payer: BLUE CROSS/BLUE SHIELD | Admitting: Physical Therapy

## 2015-02-09 ENCOUNTER — Encounter: Payer: BLUE CROSS/BLUE SHIELD | Admitting: Speech Pathology

## 2015-02-11 ENCOUNTER — Encounter: Payer: BLUE CROSS/BLUE SHIELD | Admitting: Speech Pathology

## 2015-02-11 ENCOUNTER — Other Ambulatory Visit: Payer: Self-pay | Admitting: Neurosurgery

## 2015-02-11 ENCOUNTER — Ambulatory Visit: Payer: BLUE CROSS/BLUE SHIELD | Admitting: Physical Therapy

## 2015-02-11 DIAGNOSIS — M5416 Radiculopathy, lumbar region: Secondary | ICD-10-CM

## 2015-02-22 ENCOUNTER — Ambulatory Visit: Admission: RE | Admit: 2015-02-22 | Payer: BLUE CROSS/BLUE SHIELD | Source: Ambulatory Visit

## 2015-03-14 ENCOUNTER — Encounter (INDEPENDENT_AMBULATORY_CARE_PROVIDER_SITE_OTHER): Payer: Self-pay | Admitting: Internal Medicine

## 2015-03-14 ENCOUNTER — Ambulatory Visit (INDEPENDENT_AMBULATORY_CARE_PROVIDER_SITE_OTHER): Payer: BLUE CROSS/BLUE SHIELD | Admitting: Internal Medicine

## 2015-03-14 VITALS — BP 98/70 | HR 66 | Temp 99.0°F | Resp 18 | Ht 75.0 in | Wt 164.0 lb

## 2015-03-14 DIAGNOSIS — Z431 Encounter for attention to gastrostomy: Secondary | ICD-10-CM

## 2015-03-14 DIAGNOSIS — I638 Other cerebral infarction: Secondary | ICD-10-CM | POA: Diagnosis not present

## 2015-03-14 DIAGNOSIS — I639 Cerebral infarction, unspecified: Secondary | ICD-10-CM | POA: Insufficient documentation

## 2015-03-14 DIAGNOSIS — I6389 Other cerebral infarction: Secondary | ICD-10-CM

## 2015-03-14 NOTE — Progress Notes (Signed)
Presenting complaint;  Patient is here to have PEG/PEJ removed.  Subjective:  Robert Gillespie 64 year old Caucasian male who is here to have feeding tube removed. He was last seen by me in September 2013 for screening colonoscopy. 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 He developed dysphagia secondary to CVA and required PEG/PEJ placed by IR. He has come long way. His swallowing function has fully recovered. He has not used this tube for over a month. He is accompanied by his sister and his wife today. He denies heartburn nausea vomiting or abdominal pain. He is concerned about a lump over his right iliac bone and wants me to examine this lesion.    Current Medications: Outpatient Encounter Prescriptions as of 03/14/2015  Medication Sig  . HYDROcodone-acetaminophen (NORCO) 10-325 MG tablet Take 1-2 tablets by mouth. Patient states that he takes 2 by mouth at bedtime. Rarely he may ask for another one .  Marland Kitchen Sennosides-Docusate Sodium (SENNA PLUS PO) Take by mouth. Patient takes 2 by mouth in the morning and 2 by mouth at night.  . [DISCONTINUED] amiodarone (PACERONE) 200 MG tablet Take 200 mg by mouth daily.  . [DISCONTINUED] carvedilol (COREG) 3.125 MG tablet Take 3.125 mg by mouth 2 (two) times daily with a meal.  . [DISCONTINUED] gabapentin (NEURONTIN) 300 MG capsule Take 300 mg by mouth 3 (three) times daily.  . [DISCONTINUED] HYDROcodone-acetaminophen (NORCO) 7.5-325 MG per tablet Take 1 tablet by mouth 2 (two) times daily as needed for moderate pain or severe pain (for back pain).  . [DISCONTINUED] ibuprofen (ADVIL,MOTRIN) 800 MG tablet Take 800 mg by mouth every 8 (eight) hours as needed for mild pain or moderate pain.  . [DISCONTINUED] senna-docusate (SENOKOT-S) 8.6-50 MG per tablet Take 1 tablet by mouth daily.   No  facility-administered encounter medications on file as of 03/14/2015.     Objective: Blood pressure 98/70, pulse 66, temperature 99 F (37.2 C), temperature source Oral, resp. rate 18, height 6\' 3"  (1.905 m), weight 164 lb (74.39 kg). Patient is alert and in no acute distress. Conjunctiva is pink. Sclera is nonicteric Oropharyngeal mucosa is normal. No neck masses or thyromegaly noted. Cardiac exam with regular rhythm normal S1 and S2. No murmur or gallop noted. Lungs are clear to auscultation. Abdomen is symmetrical. He has feeding tube in place. Tube entry site appears to be healthy located fairly high just below medial aspect of left costal margin. Abdomen is soft and nontender. He has about 4 x 1 cm soft subcutaneous lesion overlying the right anterior iliac spine with erythematous skin but it's very soft and nontender.  No LE edema or clubbing noted.  PEG/PEJ was removed after deflating the balloon on gastric port without any difficulty. Stoma was cleaned with alcohol and sterile dressing applied.   Assessment:  #1. PEG/PEJ removed as above since patient's swallowing function has fully recovered. #2. Small subcutaneous lipoma overlying the right anterior iliac spine. No further workup other than observation.   Plan:  Patient advised to keep stoma dry and covered with dressing until it has healed. Patient will call if lipoma over right anterior iliac spine and large is on causes pain. Office visit when necessary.

## 2015-03-14 NOTE — Patient Instructions (Signed)
Keep gastrostomy tube site dry and covered with sterile gauze until healed

## 2015-03-16 ENCOUNTER — Ambulatory Visit
Admission: RE | Admit: 2015-03-16 | Discharge: 2015-03-16 | Disposition: A | Discharge status: Home / Self Care | Payer: BLUE CROSS/BLUE SHIELD | Source: Ambulatory Visit | Attending: Neurosurgery | Admitting: Neurosurgery

## 2015-03-16 DIAGNOSIS — M5416 Radiculopathy, lumbar region: Secondary | ICD-10-CM

## 2015-03-29 ENCOUNTER — Ambulatory Visit: Admission: RE | Admit: 2015-03-29 | Payer: BLUE CROSS/BLUE SHIELD | Source: Ambulatory Visit

## 2015-04-06 ENCOUNTER — Ambulatory Visit: Payer: BLUE CROSS/BLUE SHIELD

## 2015-04-13 ENCOUNTER — Ambulatory Visit
Admission: RE | Admit: 2015-04-13 | Discharge: 2015-04-13 | Disposition: A | Discharge status: Home / Self Care | Payer: BLUE CROSS/BLUE SHIELD | Source: Ambulatory Visit | Attending: Neurosurgery | Admitting: Neurosurgery

## 2015-04-13 ENCOUNTER — Other Ambulatory Visit: Payer: Self-pay | Admitting: Neurosurgery

## 2015-04-13 ENCOUNTER — Other Ambulatory Visit
Admission: RE | Admit: 2015-04-13 | Discharge: 2015-04-13 | Disposition: A | Discharge status: Home / Self Care | Payer: BLUE CROSS/BLUE SHIELD | Source: Ambulatory Visit | Attending: Neurosurgery | Admitting: Neurosurgery

## 2015-04-13 DIAGNOSIS — Q282 Arteriovenous malformation of cerebral vessels: Secondary | ICD-10-CM | POA: Insufficient documentation

## 2015-04-13 DIAGNOSIS — M5416 Radiculopathy, lumbar region: Secondary | ICD-10-CM

## 2015-04-13 LAB — CREATININE, SERUM
Creatinine, Ser: 0.61 mg/dL (ref 0.61–1.24)
GFR calc non Af Amer: >60 mL/min (ref >60)

## 2015-10-24 ENCOUNTER — Ambulatory Visit (INDEPENDENT_AMBULATORY_CARE_PROVIDER_SITE_OTHER): Payer: BLUE CROSS/BLUE SHIELD | Admitting: Cardiology

## 2015-10-24 ENCOUNTER — Encounter: Payer: Self-pay | Admitting: Cardiology

## 2015-10-24 ENCOUNTER — Encounter: Payer: Self-pay | Admitting: *Deleted

## 2015-10-24 VITALS — BP 140/90 | HR 52 | Ht 75.0 in | Wt 191.0 lb

## 2015-10-24 DIAGNOSIS — R002 Palpitations: Secondary | ICD-10-CM | POA: Diagnosis not present

## 2015-10-24 NOTE — Progress Notes (Signed)
Patient ID: DAKAR STEGNER, male   DOB: September 04, 1950, 65 y.o.   MRN: [id number]     Clinical Summary Mr. Reineck is a 65 y.o.male seen today as a new patient, she is referred by Dr Karie Kirks for the following medical problems.  1. Palpitations - reports told he had irregular beat during recent admission at [facility] 1 year ago. 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 No detected recurrences.   - reports occasional episodes of palpitations. Typically occurs with activity. Heart rate increases to high 90s, lasts approx 30 minutes. Only occurs with activity. Feels heart beating faster, mild fatigue. Mild SOB. No lightheadness or dizziness - occurs sporadically, can have 2-3 episodes in a week or every 3 weeks. - started a few years ago, no change in frequency - coffee x1 cup per, no tea, no sodas, no energy drinks, beer 5-6 cans per day bud light. - no significant DOE. Does heavy lifting regularly at work   2. 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 Colorectal polyps     history of   History of brain AVM   No Known Allergies   Current Outpatient Prescriptions  Medication Sig Dispense Refill  . HYDROcodone-acetaminophen (NORCO) 10-325 MG tablet Take 1-2 tablets by mouth. Patient states that he takes 2 by mouth at bedtime. Rarely he may ask for another one .    Marland Kitchen Sennosides-Docusate Sodium (SENNA PLUS PO) Take by mouth. Patient takes 2 by mouth in the morning and 2 by mouth at night.     No current facility-administered medications for this visit.     Past Surgical History  Procedure Laterality Date  . Hernia repair    . Colonoscopy  02/27/2012    Procedure: COLONOSCOPY;  Surgeon: Rogene Houston, MD;  [facility];  Service: Endoscopy;  Laterality: N/A;  8:30     No Known  Allergies    Family History  Problem Relation Age of Onset  . Atrial fibrillation Mother   . Heart disease Father   . Hypertension Sister   . Glaucoma Brother      Social History Mr. Guadagnino reports that he has never smoked. He has never used smokeless tobacco. Mr. Mcelhannon reports that he drinks about 3.6 - 4.8 oz of alcohol per week.   Review of Systems CONSTITUTIONAL: No weight loss, fever, chills, weakness or fatigue.  HEENT: Eyes: No visual loss, blurred vision, double vision or yellow sclerae.No hearing loss, sneezing, congestion, runny nose or sore throat.  SKIN: No rash or itching.  CARDIOVASCULAR: per HPI RESPIRATORY: No shortness of breath, cough or sputum.  GASTROINTESTINAL: No anorexia, nausea, vomiting or diarrhea. No abdominal pain or blood.  GENITOURINARY: No burning on urination, no polyuria NEUROLOGICAL: No headache, dizziness, syncope, paralysis, ataxia, numbness or tingling in the extremities. No change in bowel or bladder control.  MUSCULOSKELETAL: No muscle, back pain, joint pain or stiffness.  LYMPHATICS: No enlarged nodes. No history of splenectomy.  PSYCHIATRIC: No history of depression or anxiety.  ENDOCRINOLOGIC: No reports of sweating, cold or heat intolerance. No polyuria or polydipsia.  Marland Kitchen   Physical Examination Filed Vitals:   10/24/15 0830  BP: 140/90  Pulse: 52   Filed Vitals:   10/24/15 0830  Height: 6\' 3"  (1.905 m)  Weight: 191 lb (86.637 kg)  Gen: resting comfortably, no acute distress HEENT: no scleral icterus, pupils equal round and reactive, no palptable cervical adenopathy,  CV: RRR, no m/r/g, no jvd Resp: Clear to auscultation bilaterally GI: abdomen is soft, non-tender, non-distended, normal bowel sounds, no hepatosplenomegaly MSK: extremities are warm, no edema.  Skin: warm, no rash Neuro:  no focal deficits Psych: appropriate affect     Assessment and Plan  1. Palpitations - isolated episode of afib after brain  surgery, from record review no documented recurrence - EKG in clinic shows NSR - continues to have palpitations, we will obtain a 21 day event monitor to further evaluate        Arnoldo Lenis, M.D.

## 2015-10-24 NOTE — Patient Instructions (Signed)
Your physician recommends that you schedule a follow-up appointment in: 4-6 Weeks with Dr. Harl Bowie  Your physician recommends that you continue on your current medications as directed. Please refer to the Current Medication list given to you today.  Your physician has recommended that you wear an event monitor. Event monitors are medical devices that record the heart's electrical activity. Doctors most often [place] these monitors to diagnose arrhythmias. Arrhythmias are problems with the speed or rhythm of the heartbeat. The monitor is a small, portable device. You can wear one while you do your normal daily activities. This is usually used to diagnose what is causing palpitations/syncope (passing out).  If you need a refill on your cardiac medications before your next appointment, please call your pharmacy.  Thank you for choosing [facility]!

## 2015-10-26 ENCOUNTER — Telehealth: Payer: Self-pay | Admitting: Cardiology

## 2015-10-30 ENCOUNTER — Ambulatory Visit (INDEPENDENT_AMBULATORY_CARE_PROVIDER_SITE_OTHER): Payer: BLUE CROSS/BLUE SHIELD

## 2015-10-30 DIAGNOSIS — R002 Palpitations: Secondary | ICD-10-CM | POA: Diagnosis not present

## 2015-11-09 ENCOUNTER — Telehealth: Payer: Self-pay | Admitting: Physician Assistant

## 2015-11-09 NOTE — Telephone Encounter (Signed)
Per event monitor, patient has a episode of afib with HR 160 today, but he is already back in rhythm now. Will continue to monitor.  Hilbert Corrigan PA Pager: [phone]

## 2015-11-10 ENCOUNTER — Telehealth: Payer: Self-pay | Admitting: *Deleted

## 2015-11-10 MED ORDER — METOPROLOL TARTRATE 25 MG PO TABS: 12.5000 mg | ORAL_TABLET | Freq: Two times a day (BID) | ORAL | Status: DC

## 2015-11-10 NOTE — Telephone Encounter (Signed)
-----   Message from Arnoldo Lenis, MD sent at 11/10/2015 11:34 AM EDT ----- Let patient know that he had an episode of atrial fibrillation on his heart monitor, it appears that is what is causing his palpitations. Have him start lopressor 12.5mg  bid to see if it helps prevent episodes. We will continue the monitor until 21 days are complete and monitor for further episodes.    Zandra Abts MD

## 2015-11-10 NOTE — Telephone Encounter (Signed)
Patient notified and voiced understanding.

## 2015-11-10 NOTE — Telephone Encounter (Signed)
Can we print out the monitor report on this please   Zandra Abts MD

## 2015-11-23 NOTE — Telephone Encounter (Signed)
Spoke with pt. He is fine on the metoprolol and has not had any symptoms or problems.

## 2015-11-23 NOTE — Telephone Encounter (Signed)
-----   Message from Arnoldo Lenis, MD sent at 11/22/2015 12:50 PM EDT ----- Patient's final monitor results does show episodes of afib, was he able to start lopressor and if so how are his symptoms doing?   Zandra Abts MD

## 2015-11-23 NOTE — Telephone Encounter (Signed)
Have called pt several times and left voicemails with no return call. I mailed the pt a letter asking him to return my call.

## 2015-11-30 ENCOUNTER — Encounter: Payer: Self-pay | Admitting: Cardiology

## 2015-11-30 ENCOUNTER — Ambulatory Visit (INDEPENDENT_AMBULATORY_CARE_PROVIDER_SITE_OTHER): Payer: Medicare Other | Admitting: Cardiology

## 2015-11-30 VITALS — BP 132/80 | HR 55 | Ht 75.0 in | Wt 192.0 lb

## 2015-11-30 DIAGNOSIS — I639 Cerebral infarction, unspecified: Secondary | ICD-10-CM | POA: Diagnosis not present

## 2015-11-30 DIAGNOSIS — R002 Palpitations: Secondary | ICD-10-CM

## 2015-11-30 DIAGNOSIS — I480 Paroxysmal atrial fibrillation: Secondary | ICD-10-CM | POA: Diagnosis not present

## 2015-11-30 NOTE — Patient Instructions (Signed)
Your physician wants you to follow-up in: 6 months You will receive a reminder letter in the mail two months in advance. If you don't receive a letter, please call our office to schedule the follow-up appointment.     Your physician recommends that you continue on your current medications as directed. Please refer to the Current Medication list given to you today.      Thank you for choosing [facility] !        

## 2015-11-30 NOTE — Progress Notes (Signed)
Clinical Summary Robert Gillespie is a 65 y.o.male seen today for follow up of the following medical problems.  1. Palpitations - reports told he had irregular beat during recent admission at [facility] 1 year ago. 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 No detected recurrences.   - reports occasional episodes of palpitations. Typically occurs with activity. Heart rate increases to high 90s, lasts approx 30 minutes. Only occurs with activity. Feels heart beating faster, mild fatigue. Mild SOB. No lightheadness or dizziness - occurs sporadically, can have 2-3 episodes in a week or every 3 weeks. - started a few years ago, no change in frequency - coffee x1 cup per, no tea, no sodas, no energy drinks, beer 5-6 cans per day bud light. - no significant DOE. Does heavy lifting regularly at work  - since last visit complted heart monitor which showed episodes of afib with RVR. We started low dose metoprolol. Since that time no recurrent episodes.  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    2. 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    Past Medical History  Diagnosis Date  . Colorectal polyps     history of      No Known Allergies   Current Outpatient Prescriptions  Medication Sig Dispense Refill  . HYDROcodone-acetaminophen (NORCO) 10-325 MG tablet Take 1-2 tablets by mouth. Patient states that he takes 2 by mouth at bedtime. Rarely he may ask for another one .    . metoprolol tartrate (LOPRESSOR) 25 MG tablet Take 0.5 tablets (12.5 mg total) by mouth 2 (two) times daily. 90 tablet 3  . Sennosides-Docusate Sodium (SENNA PLUS PO) Take by mouth. Patient takes 2 by mouth in the morning and 2  by mouth at night.     No current facility-administered medications for this visit.     Past Surgical History  Procedure Laterality Date  . Hernia repair    . Colonoscopy  02/27/2012    Procedure: COLONOSCOPY;  Surgeon: Rogene Houston, MD;  [facility]: [facility];  Service: Endoscopy;  Laterality: N/A;  8:30     No Known Allergies    Family History  Problem Relation Age of Onset  . Atrial fibrillation Mother   . Heart disease Father   . Hypertension Sister   . Glaucoma Brother      Social History Mr. Coates reports that he has never smoked. He has never used smokeless tobacco. Mr. Omana reports that he drinks about 3.6 - 4.8 oz of alcohol per week.   Review of Systems CONSTITUTIONAL: No weight loss, fever, chills, weakness or fatigue.  HEENT: Eyes: No visual loss, blurred vision, double vision or yellow sclerae.No hearing loss, sneezing, congestion, runny nose or sore throat.  SKIN: No rash or itching.  CARDIOVASCULAR: per HPI RESPIRATORY: No shortness of breath, cough or sputum.  GASTROINTESTINAL: No anorexia, nausea, vomiting or diarrhea. No abdominal pain or blood.  GENITOURINARY: No burning on urination, no polyuria NEUROLOGICAL: No headache, dizziness, syncope, paralysis, ataxia, numbness or tingling in the extremities. No change in bowel or bladder control.  MUSCULOSKELETAL: No muscle, back pain, joint pain or stiffness.  LYMPHATICS: No enlarged nodes. No history of splenectomy.  PSYCHIATRIC: No history of depression or anxiety.  ENDOCRINOLOGIC: No reports of sweating, cold or heat intolerance. No polyuria or polydipsia.  Marland Kitchen   Physical Examination Filed Vitals:   11/30/15 0820  BP: 132/80  Pulse: 55   Filed Vitals:   11/30/15 0820  Height: 6\' 3"  (1.905 m)  Weight: 192 lb (87.091 kg)    Gen: resting comfortably, no acute distress HEENT: no scleral icterus, pupils equal round and reactive, no palptable cervical adenopathy,  CV: RRR, no m/r/g, no  jvd Resp: Clear to auscultation bilaterally GI: abdomen is soft, non-tender, non-distended, normal bowel sounds, no hepatosplenomegaly MSK: extremities are warm, no edema.  Skin: warm, no rash Neuro:  no focal deficits Psych: appropriate affect   Diagnostic Studies  10/2015 Event monitor  Telemetry strips show sinus rhythm with intermittent episodes of atrial fibrillation with rapid ventricular response to the 160s  No symptoms reported  10/2014 Echo [facility] ECHOCARDIOGRAPHIC DESCRIPTIONS ----------------------------------------------- AORTIC ROOT Size: Normal Dissection: INDETERM FOR DISSECTION  AORTIC VALVE Leaflets: Tricuspid Morphology: Normal Mobility: Fully Mobile  LEFT VENTRICLE Anterior: Normal Size: Normal Lateral: Normal Contraction: Normal Septal: Normal Closest EF: >55% (Estimated) Apical: Normal LV masses: No Masses Inferior: Normal LVH: MILD LVH CONCENTRIC Posterior: Normal Dias.FxClass: RELAXATION ABNORMALITY (GRADE 1) CORRESPONDS TO E/A REVERSAL  MITRAL VALVE Leaflets: Normal Mobility: Fully mobile Morphology: Normal  LEFT ATRIUM Size: Normal LA masses: No masses Normal IAS  MAIN PA Size: Normal  PULMONIC VALVE Morphology: Normal Mobility: Fully Mobile  RIGHT VENTRICLE Size: Normal Free wall: Normal Contraction: Normal RV masses: No Masses  TRICUSPID VALVE Leaflets: Normal Mobility: Fully mobile Morphology: Normal  RIGHT ATRIUM Size: Normal RA Other: None RA masses: No masses  PERICARDIUM Fluid: No effusion  INFERIOR VENACAVA Size: Normal Normal respiratory collapse  DOPPLER ECHO and OTHER SPECIAL PROCEDURES ------------------------------------ Aortic: No AR No AS  Mitral: No MR No MS MV Inflow E Vel.= nm* cm/s MV Annulus E'Vel.= nm* cm/s E/E'Ratio= nm*  Tricuspid: TRIVIAL TR No TS  Pulmonary: TRIVIAL PR No PS  Other:  INTERPRETATION --------------------------------------------------------------- NORMAL LEFT VENTRICULAR  SYSTOLIC FUNCTION WITH MILD LVH NORMAL LA PRESSURES WITH DIASTOLIC DYSFUNCTION NORMAL RIGHT VENTRICULAR SYSTOLIC FUNCTION VALVULAR REGURGITATION: TRIVIAL PR, TRIVIAL TR NO VALVULAR STENOSIS NO PRIOR STUDY FOR COMPARISON   Assessment and Plan  1. PAF - symptoms resolved after starting lopressor - CHADS2Vasc score is 0, anticoag is not indicated. Given his history of cerebellar AVM and previous bleed and the fairly limited evidence for benefit for ASA in lone afib will not start ASA either at this time. We will message his surgeon at [facility] to get there thoughts about potential use in the future.           Arnoldo Lenis, M.D.

## 2015-12-02 ENCOUNTER — Telehealth: Payer: Self-pay | Admitting: Cardiology

## 2015-12-06 ENCOUNTER — Encounter: Payer: Self-pay | Admitting: *Deleted

## 2016-01-04 DIAGNOSIS — Z79891 Long term (current) use of opiate analgesic: Secondary | ICD-10-CM | POA: Diagnosis not present

## 2016-01-04 DIAGNOSIS — I480 Paroxysmal atrial fibrillation: Secondary | ICD-10-CM | POA: Diagnosis not present

## 2016-01-04 DIAGNOSIS — M545 Low back pain: Secondary | ICD-10-CM | POA: Diagnosis not present

## 2016-01-04 DIAGNOSIS — Q282 Arteriovenous malformation of cerebral vessels: Secondary | ICD-10-CM | POA: Diagnosis not present

## 2016-03-28 DIAGNOSIS — Z23 Encounter for immunization: Secondary | ICD-10-CM | POA: Diagnosis not present

## 2016-03-28 DIAGNOSIS — Z79891 Long term (current) use of opiate analgesic: Secondary | ICD-10-CM | POA: Diagnosis not present

## 2016-03-28 DIAGNOSIS — I480 Paroxysmal atrial fibrillation: Secondary | ICD-10-CM | POA: Diagnosis not present

## 2016-03-28 DIAGNOSIS — M545 Low back pain: Secondary | ICD-10-CM | POA: Diagnosis not present

## 2016-06-20 DIAGNOSIS — N529 Male erectile dysfunction, unspecified: Secondary | ICD-10-CM | POA: Diagnosis not present

## 2016-06-20 DIAGNOSIS — Z008 Encounter for other general examination: Secondary | ICD-10-CM | POA: Diagnosis not present

## 2016-06-20 DIAGNOSIS — Z125 Encounter for screening for malignant neoplasm of prostate: Secondary | ICD-10-CM | POA: Diagnosis not present

## 2016-06-20 DIAGNOSIS — Q282 Arteriovenous malformation of cerebral vessels: Secondary | ICD-10-CM | POA: Diagnosis not present

## 2016-06-20 DIAGNOSIS — Z79891 Long term (current) use of opiate analgesic: Secondary | ICD-10-CM | POA: Diagnosis not present

## 2016-06-20 DIAGNOSIS — Z139 Encounter for screening, unspecified: Secondary | ICD-10-CM | POA: Diagnosis not present

## 2016-06-20 DIAGNOSIS — M545 Low back pain: Secondary | ICD-10-CM | POA: Diagnosis not present

## 2016-07-04 DIAGNOSIS — N410 Acute prostatitis: Secondary | ICD-10-CM | POA: Diagnosis not present

## 2016-07-04 DIAGNOSIS — R972 Elevated prostate specific antigen [PSA]: Secondary | ICD-10-CM | POA: Diagnosis not present

## 2016-08-02 DIAGNOSIS — R972 Elevated prostate specific antigen [PSA]: Secondary | ICD-10-CM | POA: Diagnosis not present

## 2016-08-07 DIAGNOSIS — N410 Acute prostatitis: Secondary | ICD-10-CM | POA: Diagnosis not present

## 2016-09-12 DIAGNOSIS — Z79891 Long term (current) use of opiate analgesic: Secondary | ICD-10-CM | POA: Diagnosis not present

## 2016-09-12 DIAGNOSIS — Q282 Arteriovenous malformation of cerebral vessels: Secondary | ICD-10-CM | POA: Diagnosis not present

## 2016-09-12 DIAGNOSIS — M545 Low back pain: Secondary | ICD-10-CM | POA: Diagnosis not present

## 2016-09-12 DIAGNOSIS — R51 Headache: Secondary | ICD-10-CM | POA: Diagnosis not present

## 2016-12-05 DIAGNOSIS — Z79891 Long term (current) use of opiate analgesic: Secondary | ICD-10-CM | POA: Diagnosis not present

## 2016-12-05 DIAGNOSIS — Q282 Arteriovenous malformation of cerebral vessels: Secondary | ICD-10-CM | POA: Diagnosis not present

## 2016-12-05 DIAGNOSIS — M545 Low back pain: Secondary | ICD-10-CM | POA: Diagnosis not present

## 2016-12-05 DIAGNOSIS — J3089 Other allergic rhinitis: Secondary | ICD-10-CM | POA: Diagnosis not present

## 2017-01-04 IMAGING — CT CT HEAD W/O CM
1 series · 15 of 30 positions shown, 19 images · non-contrast
Comparison: None.

CLINICAL DATA: Code stroke. Headache since 3 p.m.. Defect in vision
and balance

EXAM:
CT HEAD WITHOUT CONTRAST
TECHNIQUE: Contiguous axial images were obtained from the base of the skull
through the vertex without intravenous contrast.

[Series 2: head wo · axial · 0.49mm/px · z∈[-131,+24]mm · 15 of 36 slices shown, 19 images]
[im 2/36  brain]
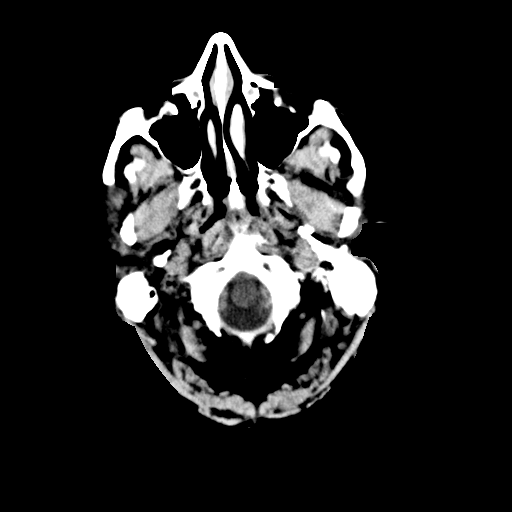
[im 2/36  bone]
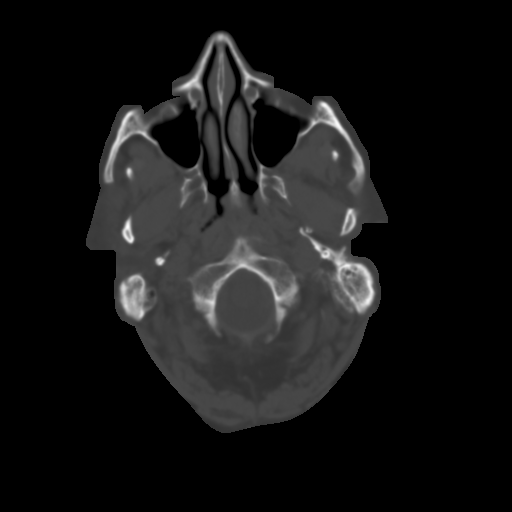
[im 4/36  brain]
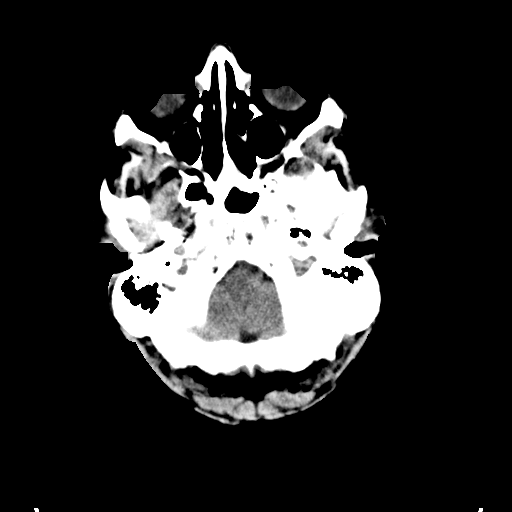
[im 7/36  brain]
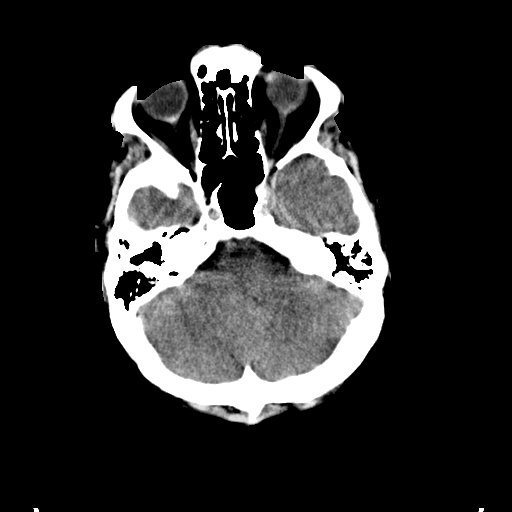
[im 9/36  brain]
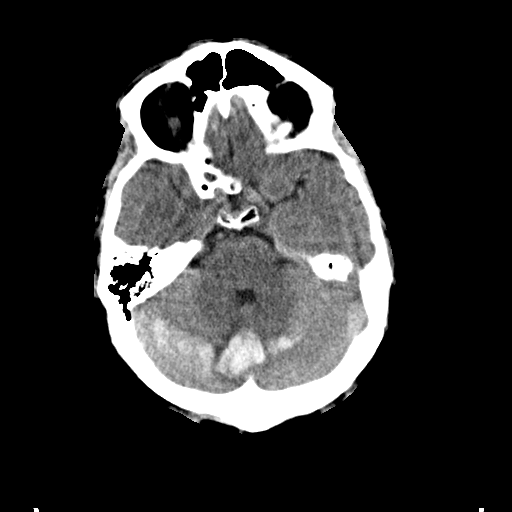
[im 11/36  brain]
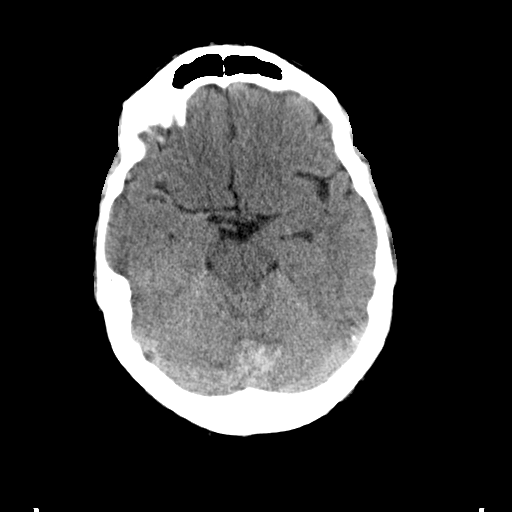
[im 11/36  bone]
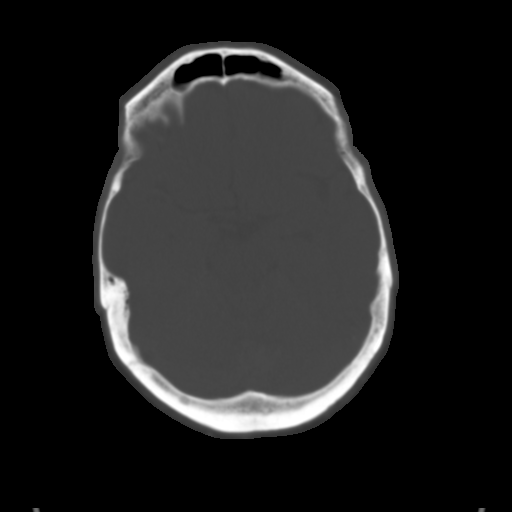
[im 14/36  brain]
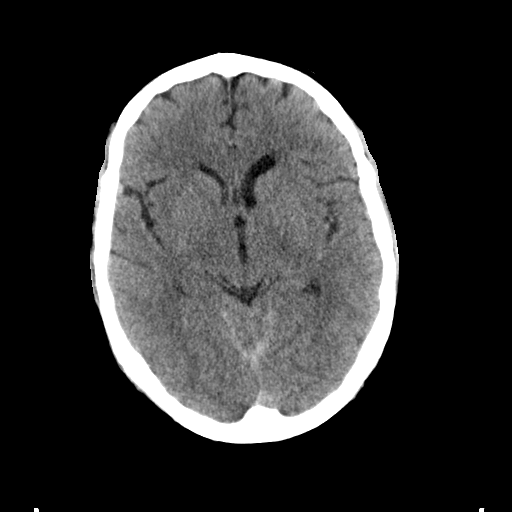
[im 16/36  brain]
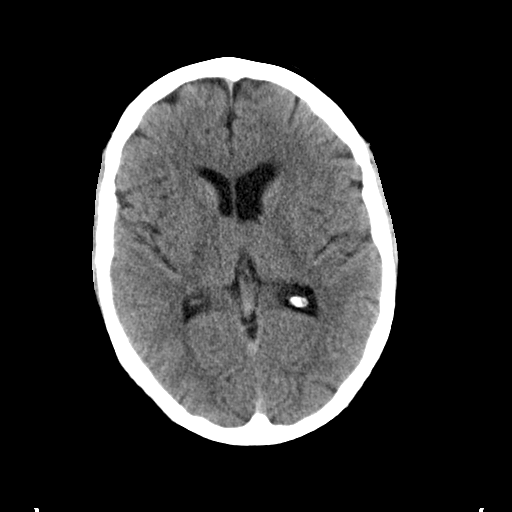
[im 19/36  brain]
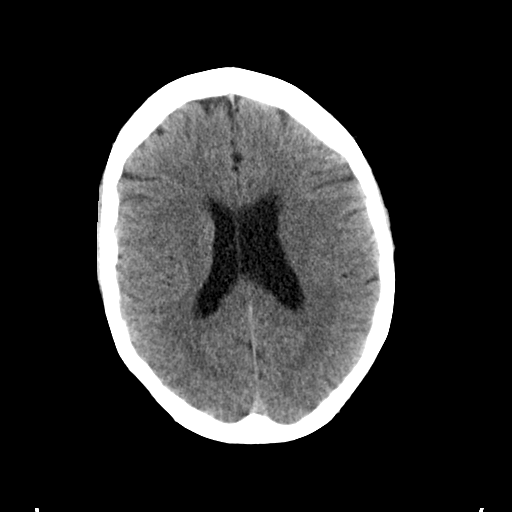
[im 20/36  brain]
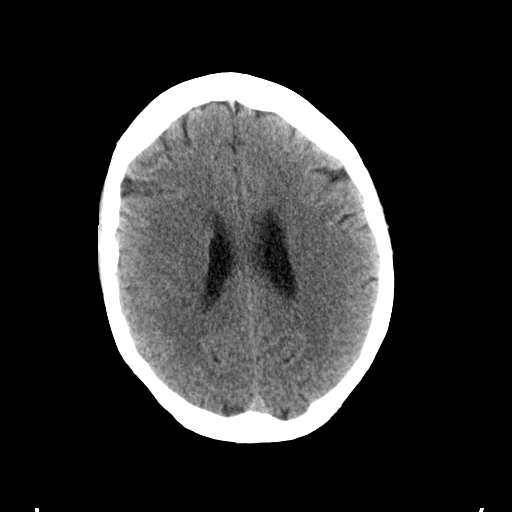
[im 20/36  bone]
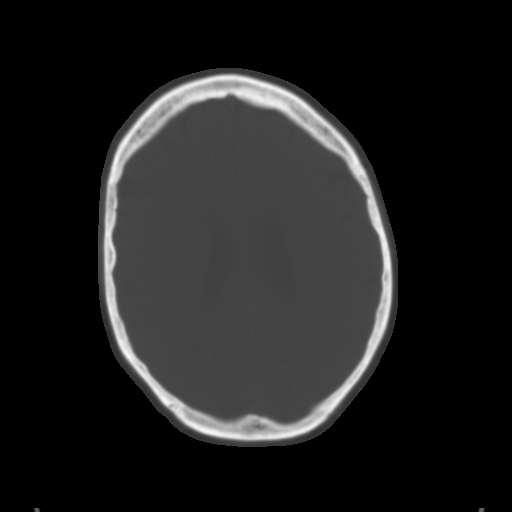
[im 22/36  brain]
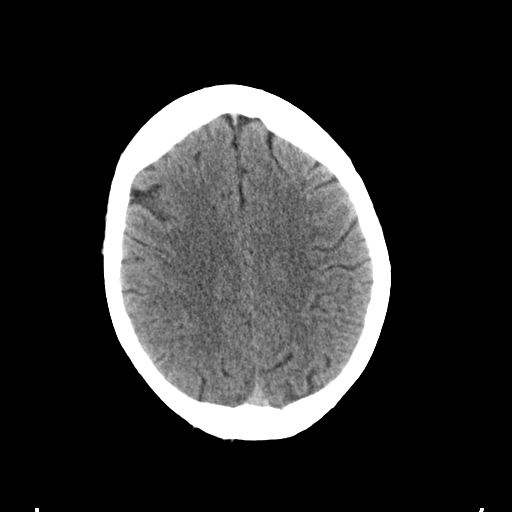
[im 25/36  brain]
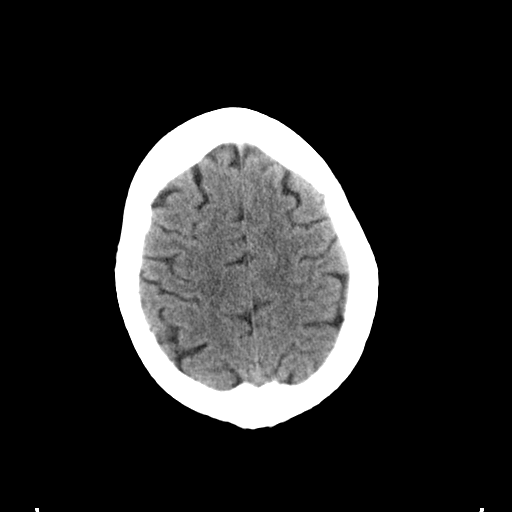
[im 27/36  brain]
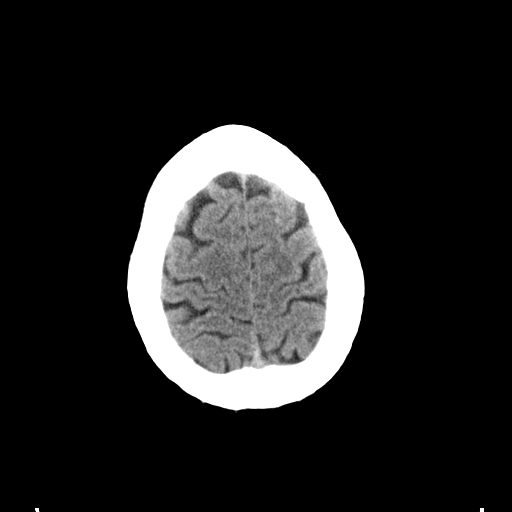
[im 29/36  brain]
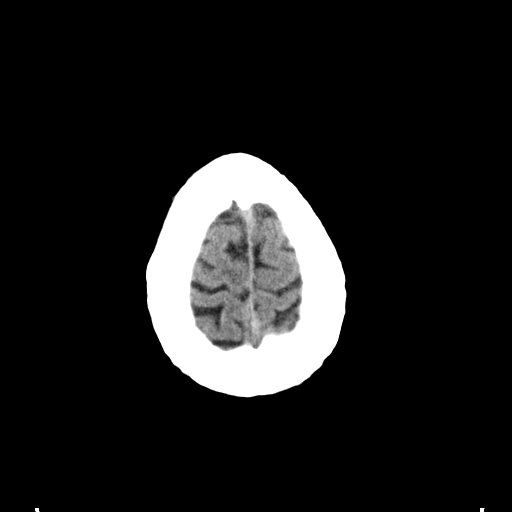
[im 29/36  bone]
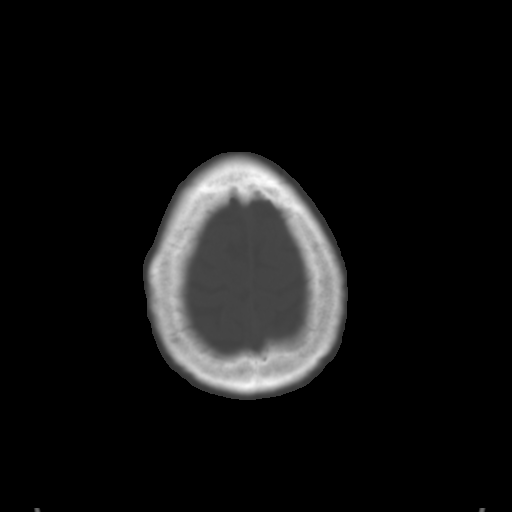
[im 32/36  brain]
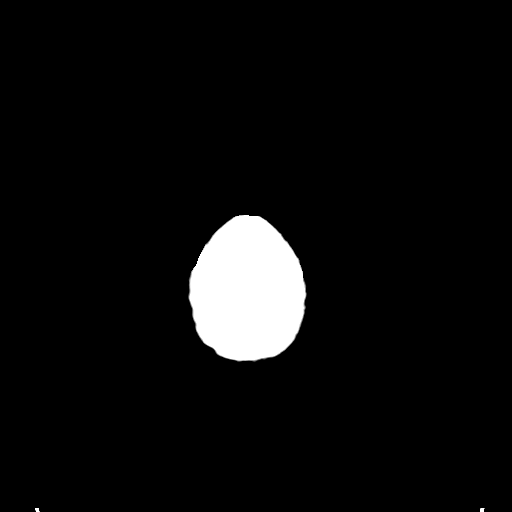
[im 34/36  brain]
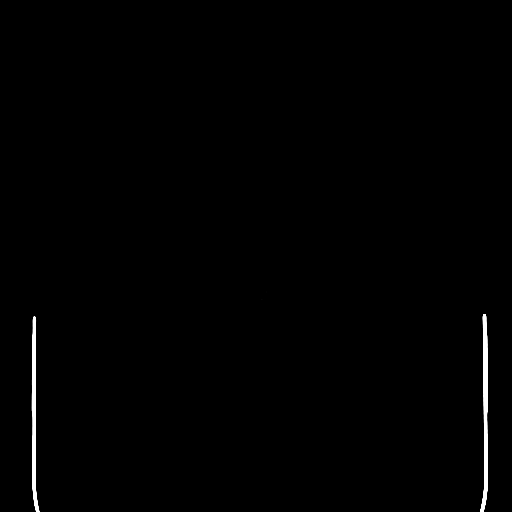

[15 of 30 positions shown; findings below may reference images not displayed]

FINDINGS: Acute hemorrhage in the cerebellum bilaterally right greater than
left. This is located in the mid posterior cerebellum and is
centered in the vermis but extends into both hemispheres. Overall
hematoma measures 20 x 68 mm. There is mild mass-effect on the right
side of the fourth ventricle. No midline shift of the fourth
ventricle. Increased density along the tentorium could represent
some subdural blood along the tentorium.

Lateral and third ventricles normal in size. No acute infarct or
mass lesion

Calvarium intact
IMPRESSION: Acute cerebellar hemorrhage centered in the vermis extending in the
both cerebellar hemispheres. Mild local mass-effect in the fourth
ventricle. Differential diagnosis includes hypertension, cocaine,
vascular malformation, and tumor. Further imaging workup is
suggested.

Critical Value/emergent results were called by telephone at the time
of interpretation on 10/30/2014 at [DATE] to Dr. MAGICWOOD LITERA ,
who verbally acknowledged these results.

## 2017-03-06 DIAGNOSIS — M545 Low back pain: Secondary | ICD-10-CM | POA: Diagnosis not present

## 2017-03-06 DIAGNOSIS — Z125 Encounter for screening for malignant neoplasm of prostate: Secondary | ICD-10-CM | POA: Diagnosis not present

## 2017-03-06 DIAGNOSIS — R51 Headache: Secondary | ICD-10-CM | POA: Diagnosis not present

## 2017-03-06 DIAGNOSIS — Z23 Encounter for immunization: Secondary | ICD-10-CM | POA: Diagnosis not present

## 2017-03-06 DIAGNOSIS — Z79891 Long term (current) use of opiate analgesic: Secondary | ICD-10-CM | POA: Diagnosis not present

## 2017-03-06 DIAGNOSIS — N529 Male erectile dysfunction, unspecified: Secondary | ICD-10-CM | POA: Diagnosis not present

## 2017-03-08 ENCOUNTER — Encounter (INDEPENDENT_AMBULATORY_CARE_PROVIDER_SITE_OTHER): Payer: Self-pay | Admitting: *Deleted

## 2017-06-18 IMAGING — MR MR LUMBAR SPINE W/O CM
4 of 5 series · 24 of 48 positions shown · non-contrast
Comparison: None.

CLINICAL DATA: Chronic low back pain with left foot numbness. No
lower extremity radicular pain. No acute injury or prior relevant
surgery. Initial encounter.

EXAM:
MRI LUMBAR SPINE WITHOUT CONTRAST
TECHNIQUE: Multiplanar, multisequence MR imaging of the lumbar spine was
performed. No intravenous contrast was administered.

[Series 2: T2 · sagittal · 4.0mm · 0.81mm/px · 6 of 17 slices shown (1 of 2)]
[im 1/17]
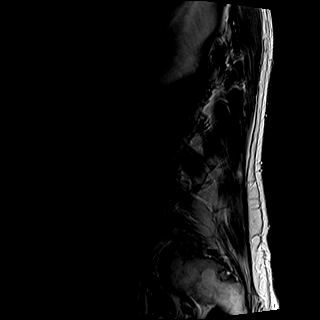
[im 4/17]
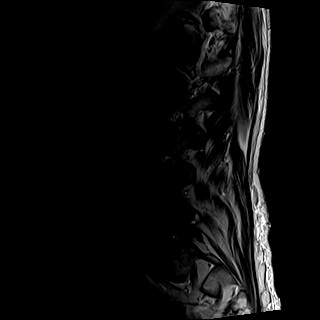
[im 7/17]
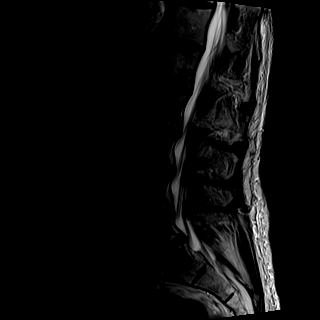
[im 10/17]
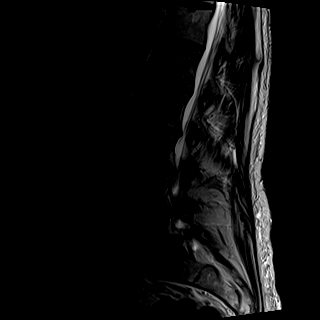
[im 13/17]
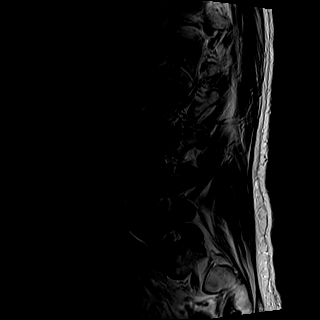
[im 17/17]
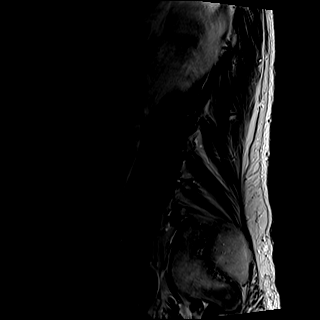

[Series 3: T1 · sagittal · 4.0mm · 0.41mm/px · 6 of 17 slices shown (1 of 2)]
[im 1/17]
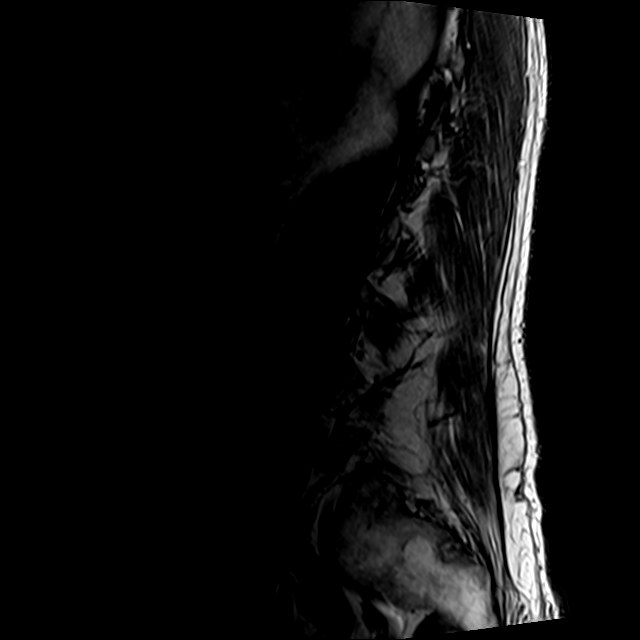
[im 4/17]
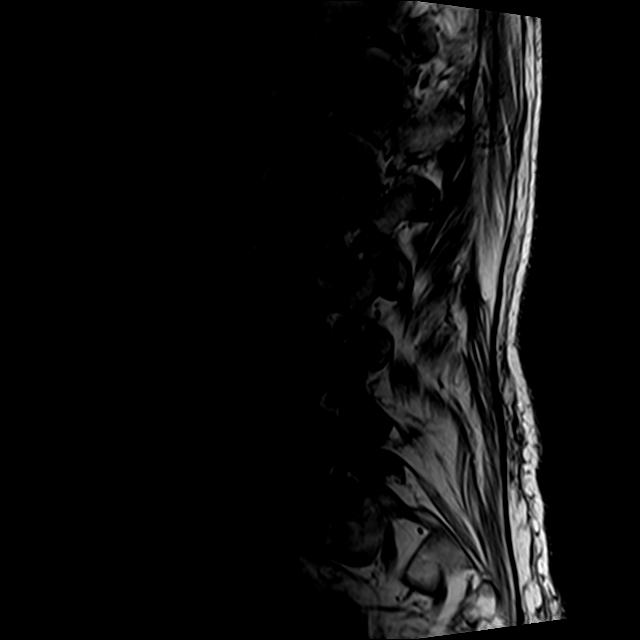
[im 7/17]
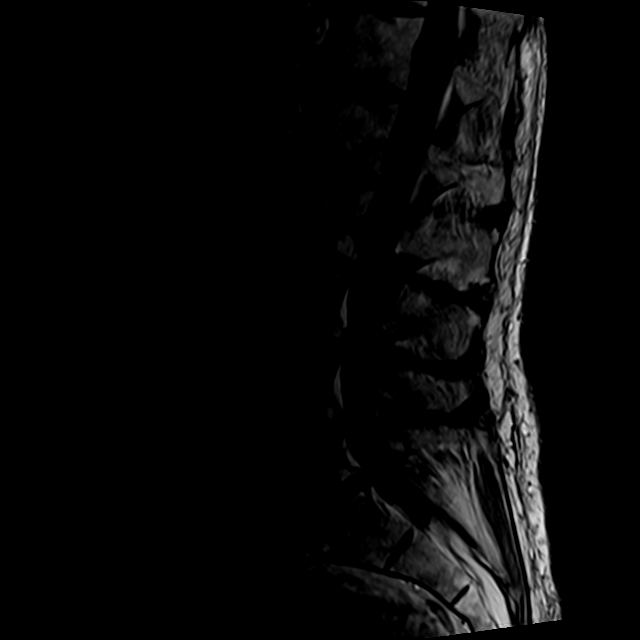
[im 10/17]
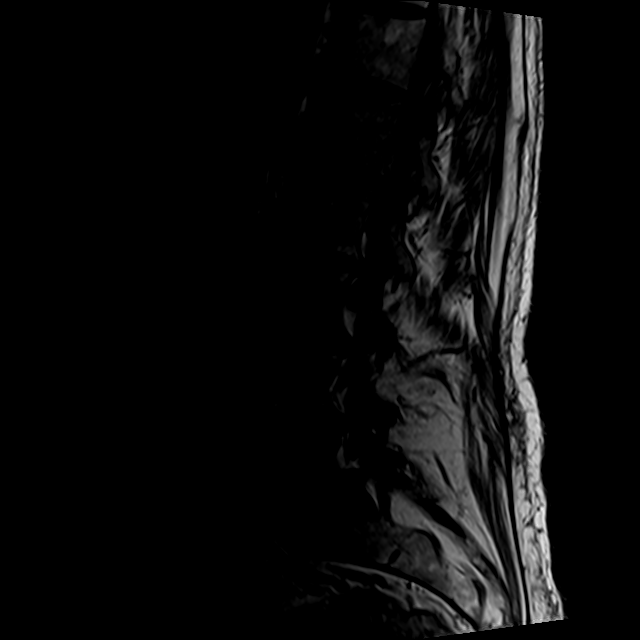
[im 13/17]
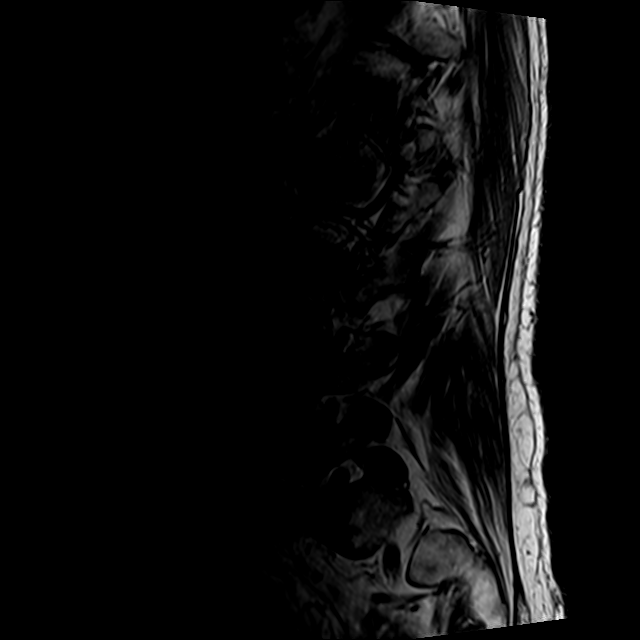
[im 17/17]
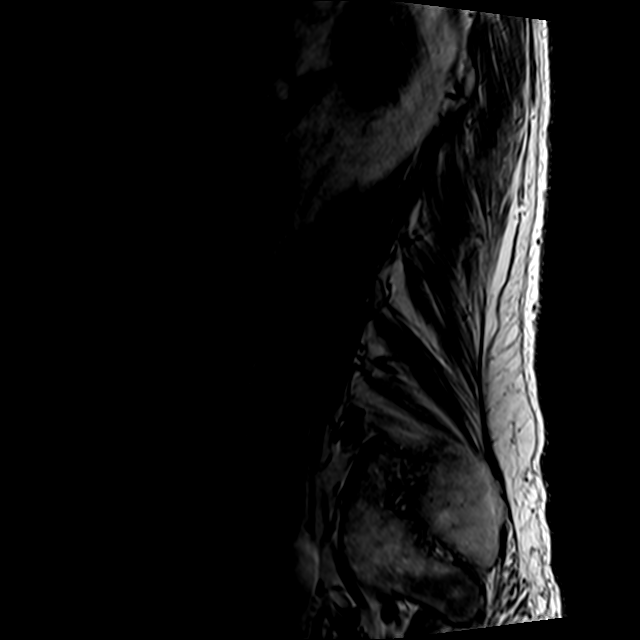

[Series 5: T2 · axial · 4.0mm · 0.78mm/px · z∈[-74,+167]mm · 9 of 42 slices shown (2 of 2)]
[im 1/42]
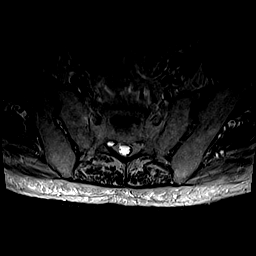
[im 6/42]
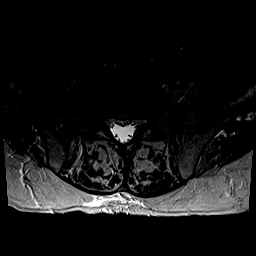
[im 12/42]
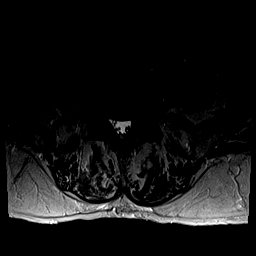
[im 18/42]
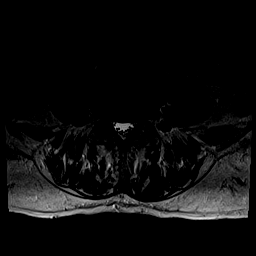
[im 21/42]
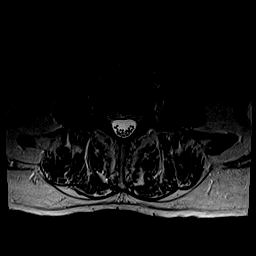
[im 24/42]
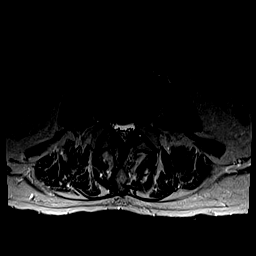
[im 30/42]
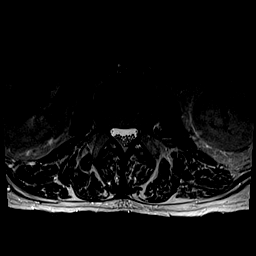
[im 36/42]
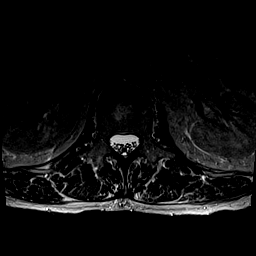
[im 42/42]
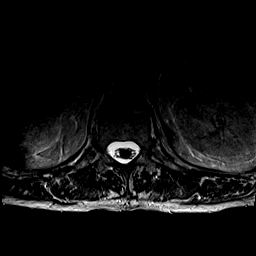

[Series 6: T1 · axial · 4.0mm · 0.31mm/px · z∈[-51,+137]mm · 3 of 42 slices shown (2 of 2)]
[im 6/42]
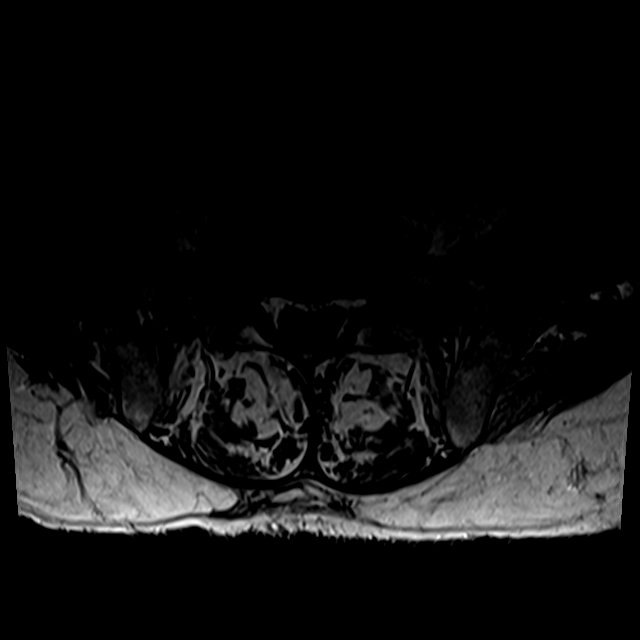
[im 21/42]
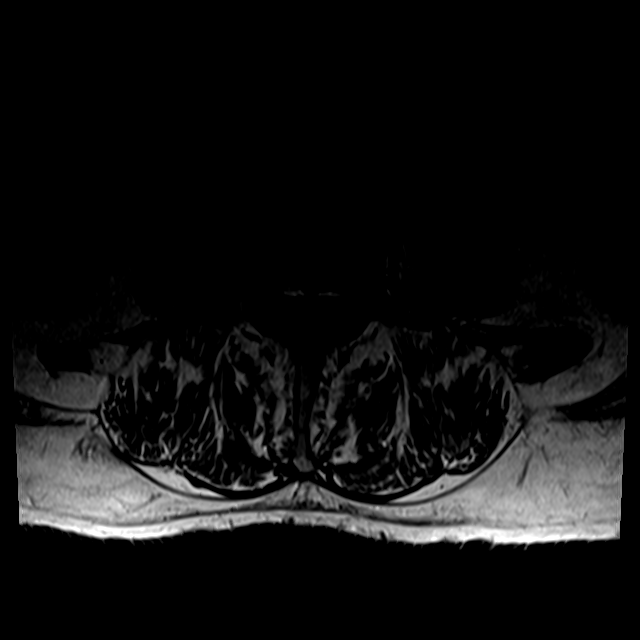
[im 36/42]
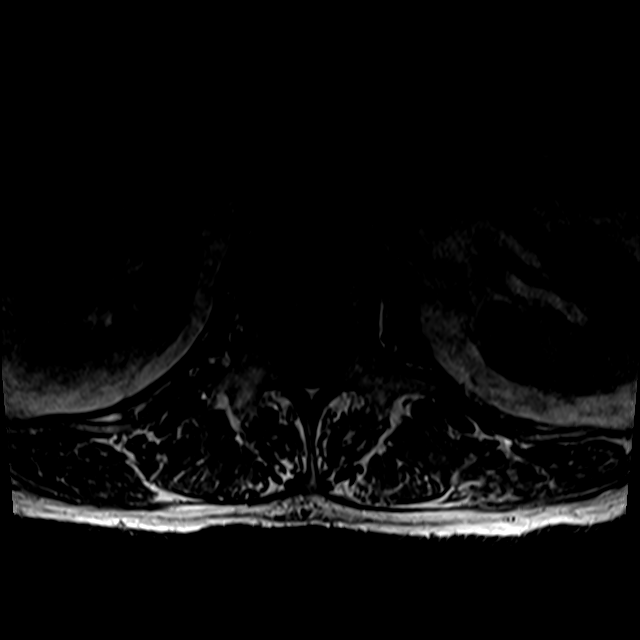

[24 of 48 positions shown; findings below may reference images not displayed]

FINDINGS: Segmentation: Conventional anatomy assumed, with the last open disc
space designated L5-S1.

Alignment:  Normal.

Bones: No worrisome osseous lesion, acute fracture or pars defect.
L1 hemangioma noted. The lumbar pedicles are diffusely short on a
congenital basis.

Conus medullaris: Extends to the L1 level and appears normal.

Paraspinal and other soft tissues: No significant paraspinal
findings.

Disc levels:

L1-2: Disc height and hydration are maintained. Minimal disc bulging
and anterior osteophytes. No spinal stenosis or nerve root
encroachment.

L2-3: Mild loss of disc height with disc bulging and a small right
paracentral disc protrusion. There is mild mass effect on the thecal
sac with mild narrowing of the right lateral recess. No definite
nerve root encroachment.

L3-4: Mild loss of disc height, shallow central disc protrusion and
mild bilateral facet hypertrophy. There is mild narrowing of the
lateral recesses. The foramina are sufficiently patent.

L4-5: Mild loss of disc height with mild disc bulging and facet
hypertrophy. There is asymmetric narrowing of the left lateral
recess. The foramina are sufficiently patent.

L5-S1: Mild loss of disc height with broad-based central disc
protrusion extending asymmetrically into the right subarticular
zone. There is mass effect on the thecal sac with narrowing of the
right lateral recess and possible right S1 nerve root encroachment.
Mild facet degenerative changes are present bilaterally,
contributing to mild foraminal narrowing.
IMPRESSION: 1. No acute findings or clear explanation for the patient's
symptoms.
2. There is mild multilevel spondylosis superimposed on a
congenitally small spinal canal. There is resulting mild narrowing
of the lateral recesses and foramina as described, but no definite
left-sided nerve root encroachment.

## 2019-10-29 ENCOUNTER — Encounter: Payer: Self-pay | Admitting: General Surgery

## 2019-10-29 ENCOUNTER — Ambulatory Visit (INDEPENDENT_AMBULATORY_CARE_PROVIDER_SITE_OTHER): Payer: Medicare Other | Admitting: General Surgery

## 2019-10-29 ENCOUNTER — Other Ambulatory Visit: Payer: Self-pay

## 2019-10-29 VITALS — BP 124/80 | HR 105 | Temp 98.4°F | Ht 75.0 in | Wt 202.0 lb

## 2019-10-29 DIAGNOSIS — K4090 Unilateral inguinal hernia, without obstruction or gangrene, not specified as recurrent: Secondary | ICD-10-CM | POA: Diagnosis not present

## 2019-10-29 NOTE — Patient Instructions (Signed)

## 2019-10-30 NOTE — Progress Notes (Signed)
Robert Gillespie; [id number]; [birth year]   HPI Patient is a 69 year old white male who was referred to my care by Dr. Karie Kirks for evaluation treatment of a left inguinal hernia.  Patient states he noticed the hernia developing over the past 2 months.  Is made worse with straining.  He is able to reduce it.  He denies any nausea or vomiting.  He currently has 0 out of 10 abdominal pain. Past Medical History:  Diagnosis Date  . Colorectal polyps    history of     Past Surgical History:  Procedure Laterality Date  . COLONOSCOPY  02/27/2012   Procedure: COLONOSCOPY;  Surgeon: Rogene Houston, MD;  Location: [facility];  Service: Endoscopy;  Laterality: N/A;  8:30  . HERNIA REPAIR      Family History  Problem Relation Age of Onset  . Atrial fibrillation Mother   . Heart disease Father   . Hypertension Sister   . Glaucoma Brother     Current Outpatient Medications on File Prior to Visit  Medication Sig Dispense Refill  . HYDROcodone-acetaminophen (NORCO) 10-325 MG tablet Take 1-2 tablets by mouth. Patient states that he takes 2 by mouth at bedtime. Rarely he may ask for another one .    . metoprolol succinate (TOPROL-XL) 25 MG 24 hr tablet Take 25 mg by mouth every morning.     No current facility-administered medications on file prior to visit.    No Known Allergies  Social History   Substance and Sexual Activity  Alcohol Use Yes  . Alcohol/week: 6.0 - 8.0 standard drinks  . Types: 6 - 8 Cans of beer per week   Comment: 6-8 beers a day    Social History   Tobacco Use  Smoking Status Never Smoker  Smokeless Tobacco Never Used    Review of Systems  Constitutional: Negative.   HENT: Negative.   Eyes: Negative.   Respiratory: Negative.   Cardiovascular: Negative.   Gastrointestinal: Negative.   Genitourinary: Negative.   Musculoskeletal: Positive for back pain.  Skin: Negative.   Neurological: Negative.   Endo/Heme/Allergies: Negative.   Psychiatric/Behavioral:  Negative.     Objective   Vitals:   10/29/19 0922  BP: 124/80  Pulse: (!) 105  Temp: 98.4 F (36.9 C)  SpO2: 97%    Physical Exam Vitals reviewed.  Constitutional:      Appearance: Normal appearance. He is normal weight. He is not ill-appearing.  HENT:     Head: Normocephalic and atraumatic.  Cardiovascular:     Rate and Rhythm: Normal rate and regular rhythm.     Heart sounds: Normal heart sounds. No murmur. No friction rub. No gallop.   Pulmonary:     Effort: Pulmonary effort is normal. No respiratory distress.     Breath sounds: Normal breath sounds. No stridor. No wheezing, rhonchi or rales.  Abdominal:     General: Abdomen is flat. Bowel sounds are normal. There is no distension.     Palpations: Abdomen is soft. There is no mass.     Tenderness: There is no abdominal tenderness. There is no guarding or rebound.     Hernia: A hernia is present.     Comments: Reducible left inguinal hernia noted.  Genitourinary:    Comments: Genitourinary examination is within normal limits. Skin:    General: Skin is warm and dry.  Neurological:     Mental Status: He is alert and oriented to person, place, and time.   Primary care notes reviewed  Assessment  Left inguinal hernia Plan   Patient is scheduled for left inguinal herniorrhaphy with mesh on 11/18/2019.  The risks and benefits of the procedure including bleeding, infection, mesh use, the possibility of recurrence of the hernia were fully explained to the patient, who gave informed consent.

## 2019-10-30 NOTE — H&P (Signed)
Robert Gillespie; [id number]; [birth year]   HPI Patient is a 69 year old white male who was referred to my care by Dr. Karie Kirks for evaluation treatment of a left inguinal hernia.  Patient states he noticed the hernia developing over the past 2 months.  Is made worse with straining.  He is able to reduce it.  He denies any nausea or vomiting.  He currently has 0 out of 10 abdominal pain. Past Medical History:  Diagnosis Date  . Colorectal polyps    history of     Past Surgical History:  Procedure Laterality Date  . COLONOSCOPY  02/27/2012   Procedure: COLONOSCOPY;  Surgeon: Rogene Houston, MD;  Location: [facility];  Service: Endoscopy;  Laterality: N/A;  8:30  . HERNIA REPAIR      Family History  Problem Relation Age of Onset  . Atrial fibrillation Mother   . Heart disease Father   . Hypertension Sister   . Glaucoma Brother     Current Outpatient Medications on File Prior to Visit  Medication Sig Dispense Refill  . HYDROcodone-acetaminophen (NORCO) 10-325 MG tablet Take 1-2 tablets by mouth. Patient states that he takes 2 by mouth at bedtime. Rarely he may ask for another one .    . metoprolol succinate (TOPROL-XL) 25 MG 24 hr tablet Take 25 mg by mouth every morning.     No current facility-administered medications on file prior to visit.    No Known Allergies  Social History   Substance and Sexual Activity  Alcohol Use Yes  . Alcohol/week: 6.0 - 8.0 standard drinks  . Types: 6 - 8 Cans of beer per week   Comment: 6-8 beers a day    Social History   Tobacco Use  Smoking Status Never Smoker  Smokeless Tobacco Never Used    Review of Systems  Constitutional: Negative.   HENT: Negative.   Eyes: Negative.   Respiratory: Negative.   Cardiovascular: Negative.   Gastrointestinal: Negative.   Genitourinary: Negative.   Musculoskeletal: Positive for back pain.  Skin: Negative.   Neurological: Negative.   Endo/Heme/Allergies: Negative.   Psychiatric/Behavioral:  Negative.     Objective   Vitals:   10/29/19 0922  BP: 124/80  Pulse: (!) 105  Temp: 98.4 F (36.9 C)  SpO2: 97%    Physical Exam Vitals reviewed.  Constitutional:      Appearance: Normal appearance. He is normal weight. He is not ill-appearing.  HENT:     Head: Normocephalic and atraumatic.  Cardiovascular:     Rate and Rhythm: Normal rate and regular rhythm.     Heart sounds: Normal heart sounds. No murmur. No friction rub. No gallop.   Pulmonary:     Effort: Pulmonary effort is normal. No respiratory distress.     Breath sounds: Normal breath sounds. No stridor. No wheezing, rhonchi or rales.  Abdominal:     General: Abdomen is flat. Bowel sounds are normal. There is no distension.     Palpations: Abdomen is soft. There is no mass.     Tenderness: There is no abdominal tenderness. There is no guarding or rebound.     Hernia: A hernia is present.     Comments: Reducible left inguinal hernia noted.  Genitourinary:    Comments: Genitourinary examination is within normal limits. Skin:    General: Skin is warm and dry.  Neurological:     Mental Status: He is alert and oriented to person, place, and time.   Primary care notes reviewed  Assessment  Left inguinal hernia Plan   Patient is scheduled for left inguinal herniorrhaphy with mesh on 11/18/2019.  The risks and benefits of the procedure including bleeding, infection, mesh use, the possibility of recurrence of the hernia were fully explained to the patient, who gave informed consent.

## 2019-11-11 NOTE — Patient Instructions (Signed)
Robert Gillespie  11/11/2019     @PREFPERIOPPHARMACY @   Your procedure is scheduled on  11/18/2019.  Report to [facility] at  0700  A.M.  Call this number if you have problems the morning of surgery:  [phone]   Remember:  Do not eat or drink after midnight.                         Take these medicines the morning of surgery with A SIP OF WATER  Hydrocodone (if needed), metoprolol.    Do not wear jewelry, make-up or nail polish.  Do not wear lotions, powders, or perfumes. Please wear deodorant and brush your teeth.  Do not shave 48 hours prior to surgery.  Men may shave face and neck.  Do not bring valuables to the hospital.  [facility] is not responsible for any belongings or valuables.  Contacts, dentures or bridgework may not be worn into surgery.  Leave your suitcase in the car.  After surgery it may be brought to your room.  For patients admitted to the hospital, discharge time will be determined by your treatment team.  Patients discharged the day of surgery will not be allowed to drive home.   Name and phone number of your driver:   family Special instructions:  DO NOT smoke the day of your surgery.  Please read over the following fact sheets that you were given. Anesthesia Post-op Instructions and Care and Recovery After Surgery       Open Hernia Repair, Adult, Care After These instructions give you information about caring for yourself after your procedure. Your doctor may also give you more specific instructions. If you have problems or questions, contact your doctor. Follow these instructions at home: Surgical cut (incision) care   Follow instructions from your doctor about how to take care of your surgical cut area. Make sure you: ? Wash your hands with soap and water before you change your bandage (dressing). If you cannot use soap and water, use hand sanitizer. ? Change your bandage as told by your doctor. ? Leave stitches (sutures), skin  glue, or skin tape (adhesive) strips in place. They may need to stay in place for 2 weeks or longer. If tape strips get loose and curl up, you may trim the loose edges. Do not remove tape strips completely unless your doctor says it is okay.  Check your surgical cut every day for signs of infection. Check for: ? More redness, swelling, or pain. ? More fluid or blood. ? Warmth. ? Pus or a bad smell. Activity  Do not drive or use heavy machinery while taking prescription pain medicine. Do not drive until your doctor says it is okay.  Until your doctor says it is okay: ? Do not lift anything that is heavier than 10 lb (4.5 kg). ? Do not play contact sports.  Return to your normal activities as told by your doctor. Ask your doctor what activities are safe. General instructions  To prevent or treat having a hard time pooping (constipation) while you are taking prescription pain medicine, your doctor may recommend that you: ? Drink enough fluid to keep your pee (urine) clear or pale yellow. ? Take over-the-counter or prescription medicines. ? Eat foods that are high in fiber, such as fresh fruits and vegetables, whole grains, and beans. ? Limit foods that are high in fat and processed sugars, such as  fried and sweet foods.  Take over-the-counter and prescription medicines only as told by your doctor.  Do not take baths, swim, or use a hot tub until your doctor says it is okay.  Keep all follow-up visits as told by your doctor. This is important. Contact a doctor if:  You develop a rash.  You have more redness, swelling, or pain around your surgical cut.  You have more fluid or blood coming from your surgical cut.  Your surgical cut feels warm to the touch.  You have pus or a bad smell coming from your surgical cut.  You have a fever or chills.  You have blood in your poop (stool).  You have not pooped in 2-3 days.  Medicine does not help your pain. Get help right away  if:  You have chest pain or you are short of breath.  You feel light-headed.  You feel weak and dizzy (feel faint).  You have very bad pain.  You throw up (vomit) and your pain is worse. This information is not intended to replace advice given to you by your health care provider. Make sure you discuss any questions you have with your health care provider. Document Revised: 09/19/2018 Document Reviewed: 11/09/2015 Elsevier Patient Education  2020 [place] Anesthesia, Adult, Care After This sheet gives you information about how to care for yourself after your procedure. Your health care provider may also give you more specific instructions. If you have problems or questions, contact your health care provider. What can I expect after the procedure? After the procedure, the following side effects are common:  Pain or discomfort at the IV site.  Nausea.  Vomiting.  Sore throat.  Trouble concentrating.  Feeling cold or chills.  Weak or tired.  Sleepiness and fatigue.  Soreness and body aches. These side effects can affect parts of the body that were not involved in surgery. Follow these instructions at home:  For at least 24 hours after the procedure:  Have a responsible adult stay with you. It is important to have someone help care for you until you are awake and alert.  Rest as needed.  Do not: ? Participate in activities in which you could fall or become injured. ? Drive. ? Use heavy machinery. ? Drink alcohol. ? Take sleeping pills or medicines that cause drowsiness. ? Make important decisions or sign legal documents. ? Take care of children on your own. Eating and drinking  Follow any instructions from your health care provider about eating or drinking restrictions.  When you feel hungry, start by eating small amounts of foods that are soft and easy to digest (bland), such as toast. Gradually return to your regular diet.  Drink enough fluid to  keep your urine pale yellow.  If you vomit, rehydrate by drinking water, juice, or clear broth. General instructions  If you have sleep apnea, surgery and certain medicines can increase your risk for breathing problems. Follow instructions from your health care provider about wearing your sleep device: ? Anytime you are sleeping, including during daytime naps. ? While taking prescription pain medicines, sleeping medicines, or medicines that make you drowsy.  Return to your normal activities as told by your health care provider. Ask your health care provider what activities are safe for you.  Take over-the-counter and prescription medicines only as told by your health care provider.  If you smoke, do not smoke without supervision.  Keep all follow-up visits as told by your health care  provider. This is important. Contact a health care provider if:  You have nausea or vomiting that does not get better with medicine.  You cannot eat or drink without vomiting.  You have pain that does not get better with medicine.  You are unable to pass urine.  You develop a skin rash.  You have a fever.  You have redness around your IV site that gets worse. Get help right away if:  You have difficulty breathing.  You have chest pain.  You have blood in your urine or stool, or you vomit blood. Summary  After the procedure, it is common to have a sore throat or nausea. It is also common to feel tired.  Have a responsible adult stay with you for the first 24 hours after general anesthesia. It is important to have someone help care for you until you are awake and alert.  When you feel hungry, start by eating small amounts of foods that are soft and easy to digest (bland), such as toast. Gradually return to your regular diet.  Drink enough fluid to keep your urine pale yellow.  Return to your normal activities as told by your health care provider. Ask your health care provider what activities  are safe for you. This information is not intended to replace advice given to you by your health care provider. Make sure you discuss any questions you have with your health care provider. Document Revised: 05/31/2017 Document Reviewed: 01/11/2017 Elsevier Patient Education  [place]. How to Use Chlorhexidine for Bathing Chlorhexidine gluconate (CHG) is a germ-killing (antiseptic) solution that is used to clean the skin. It can get rid of the bacteria that normally live on the skin and can keep them away for about 24 hours. To clean your skin with CHG, you may be given:  A CHG solution to use in the shower or as part of a sponge bath.  A prepackaged cloth that contains CHG. Cleaning your skin with CHG may help lower the risk for infection:  While you are staying in the intensive care unit of the hospital.  If you have a vascular access, such as a central line, to provide short-term or long-term access to your veins.  If you have a catheter to drain urine from your bladder.  If you are on a ventilator. A ventilator is a machine that helps you breathe by moving air in and out of your lungs.  After surgery. What are the risks? Risks of using CHG include:  A skin reaction.  Hearing loss, if CHG gets in your ears.  Eye injury, if CHG gets in your eyes and is not rinsed out.  The CHG product catching fire. Make sure that you avoid smoking and flames after applying CHG to your skin. Do not use CHG:  If you have a chlorhexidine allergy or have previously reacted to chlorhexidine.  On babies younger than 19 months of age. How to use CHG solution  Use CHG only as told by your health care provider, and follow the instructions on the label.  Use the full amount of CHG as directed. Usually, this is one bottle. During a shower Follow these steps when using CHG solution during a shower (unless your health care provider gives you different instructions): 1. Start the  shower. 2. Use your normal soap and shampoo to wash your face and hair. 3. Turn off the shower or move out of the shower stream. 4. Pour the CHG onto a clean washcloth.  Do not use any type of brush or rough-edged sponge. 5. Starting at your neck, lather your body down to your toes. Make sure you follow these instructions: ? If you will be having surgery, pay special attention to the part of your body where you will be having surgery. Scrub this area for at least 1 minute. ? Do not use CHG on your head or face. If the solution gets into your ears or eyes, rinse them well with water. ? Avoid your genital area. ? Avoid any areas of skin that have broken skin, cuts, or scrapes. ? Scrub your back and under your arms. Make sure to wash skin folds. 6. Let the lather sit on your skin for 1-2 minutes or as long as told by your health care provider. 7. Thoroughly rinse your entire body in the shower. Make sure that all body creases and crevices are rinsed well. 8. Dry off with a clean towel. Do not put any substances on your body afterward--such as powder, lotion, or perfume--unless you are told to do so by your health care provider. Only use lotions that are recommended by the manufacturer. 9. Put on clean clothes or pajamas. 10. If it is the night before your surgery, sleep in clean sheets.  During a sponge bath Follow these steps when using CHG solution during a sponge bath (unless your health care provider gives you different instructions): 1. Use your normal soap and shampoo to wash your face and hair. 2. Pour the CHG onto a clean washcloth. 3. Starting at your neck, lather your body down to your toes. Make sure you follow these instructions: ? If you will be having surgery, pay special attention to the part of your body where you will be having surgery. Scrub this area for at least 1 minute. ? Do not use CHG on your head or face. If the solution gets into your ears or eyes, rinse them well with  water. ? Avoid your genital area. ? Avoid any areas of skin that have broken skin, cuts, or scrapes. ? Scrub your back and under your arms. Make sure to wash skin folds. 4. Let the lather sit on your skin for 1-2 minutes or as long as told by your health care provider. 5. Using a different clean, wet washcloth, thoroughly rinse your entire body. Make sure that all body creases and crevices are rinsed well. 6. Dry off with a clean towel. Do not put any substances on your body afterward--such as powder, lotion, or perfume--unless you are told to do so by your health care provider. Only use lotions that are recommended by the manufacturer. 7. Put on clean clothes or pajamas. 8. If it is the night before your surgery, sleep in clean sheets. How to use CHG prepackaged cloths  Only use CHG cloths as told by your health care provider, and follow the instructions on the label.  Use the CHG cloth on clean, dry skin.  Do not use the CHG cloth on your head or face unless your health care provider tells you to.  When washing with the CHG cloth: ? Avoid your genital area. ? Avoid any areas of skin that have broken skin, cuts, or scrapes. Before surgery Follow these steps when using a CHG cloth to clean before surgery (unless your health care provider gives you different instructions): 1. Using the CHG cloth, vigorously scrub the part of your body where you will be having surgery. Scrub using a back-and-forth motion for 3 minutes.  The area on your body should be completely wet with CHG when you are done scrubbing. 2. Do not rinse. Discard the cloth and let the area air-dry. Do not put any substances on the area afterward, such as powder, lotion, or perfume. 3. Put on clean clothes or pajamas. 4. If it is the night before your surgery, sleep in clean sheets.  For general bathing Follow these steps when using CHG cloths for general bathing (unless your health care provider gives you different  instructions). 1. Use a separate CHG cloth for each area of your body. Make sure you wash between any folds of skin and between your fingers and toes. Wash your body in the following order, switching to a new cloth after each step: ? The front of your neck, shoulders, and chest. ? Both of your arms, under your arms, and your hands. ? Your stomach and groin area, avoiding the genitals. ? Your right leg and foot. ? Your left leg and foot. ? The back of your neck, your back, and your buttocks. 2. Do not rinse. Discard the cloth and let the area air-dry. Do not put any substances on your body afterward--such as powder, lotion, or perfume--unless you are told to do so by your health care provider. Only use lotions that are recommended by the manufacturer. 3. Put on clean clothes or pajamas. Contact a health care provider if:  Your skin gets irritated after scrubbing.  You have questions about using your solution or cloth. Get help right away if:  Your eyes become very red or swollen.  Your eyes itch badly.  Your skin itches badly and is red or swollen.  Your hearing changes.  You have trouble seeing.  You have swelling or tingling in your mouth or throat.  You have trouble breathing.  You swallow any chlorhexidine. Summary  Chlorhexidine gluconate (CHG) is a germ-killing (antiseptic) solution that is used to clean the skin. Cleaning your skin with CHG may help to lower your risk for infection.  You may be given CHG to use for bathing. It may be in a bottle or in a prepackaged cloth to use on your skin. Carefully follow your health care provider's instructions and the instructions on the product label.  Do not use CHG if you have a chlorhexidine allergy.  Contact your health care provider if your skin gets irritated after scrubbing. This information is not intended to replace advice given to you by your health care provider. Make sure you discuss any questions you have with your  health care provider. Document Revised: 08/14/2018 Document Reviewed: 04/25/2017 Elsevier Patient Education  [place].

## 2019-11-16 ENCOUNTER — Encounter (HOSPITAL_COMMUNITY): Payer: Self-pay

## 2019-11-16 ENCOUNTER — Other Ambulatory Visit (HOSPITAL_COMMUNITY)
Admission: RE | Admit: 2019-11-16 | Discharge: 2019-11-16 | Disposition: A | Discharge status: Home / Self Care | Payer: Medicare Other | Source: Ambulatory Visit | Attending: General Surgery | Admitting: General Surgery

## 2019-11-16 ENCOUNTER — Encounter (HOSPITAL_COMMUNITY)
Admission: RE | Admit: 2019-11-16 | Discharge: 2019-11-16 | Disposition: A | Discharge status: Home / Self Care | Payer: Medicare Other | Source: Ambulatory Visit | Attending: General Surgery | Admitting: General Surgery

## 2019-11-16 ENCOUNTER — Other Ambulatory Visit: Payer: Self-pay

## 2019-11-16 DIAGNOSIS — I4891 Unspecified atrial fibrillation: Secondary | ICD-10-CM | POA: Insufficient documentation

## 2019-11-16 DIAGNOSIS — Z20822 Contact with and (suspected) exposure to covid-19: Secondary | ICD-10-CM | POA: Insufficient documentation

## 2019-11-16 DIAGNOSIS — Z01818 Encounter for other preprocedural examination: Secondary | ICD-10-CM | POA: Insufficient documentation

## 2019-11-16 HISTORY — DX: Unspecified atrial fibrillation: I48.91

## 2019-11-16 HISTORY — DX: Sleep apnea, unspecified: G47.30

## 2019-11-17 ENCOUNTER — Encounter (HOSPITAL_COMMUNITY): Payer: Self-pay | Admitting: Anesthesiology

## 2019-11-17 LAB — SARS CORONAVIRUS 2 (TAT 6-24 HRS): SARS Coronavirus 2: NEGATIVE

## 2019-11-18 ENCOUNTER — Ambulatory Visit (INDEPENDENT_AMBULATORY_CARE_PROVIDER_SITE_OTHER): Payer: Medicare Other | Admitting: Cardiology

## 2019-11-18 ENCOUNTER — Ambulatory Visit (HOSPITAL_COMMUNITY): Admission: RE | Admit: 2019-11-18 | Payer: Medicare Other | Source: Home / Self Care | Admitting: General Surgery

## 2019-11-18 ENCOUNTER — Encounter (HOSPITAL_COMMUNITY): Admission: RE | Payer: Self-pay | Source: Home / Self Care

## 2019-11-18 ENCOUNTER — Other Ambulatory Visit: Payer: Self-pay

## 2019-11-18 ENCOUNTER — Encounter: Payer: Self-pay | Admitting: Cardiology

## 2019-11-18 VITALS — BP 122/80 | HR 74 | Ht 75.0 in | Wt 201.8 lb

## 2019-11-18 DIAGNOSIS — I4891 Unspecified atrial fibrillation: Secondary | ICD-10-CM

## 2019-11-18 DIAGNOSIS — Z01810 Encounter for preprocedural cardiovascular examination: Secondary | ICD-10-CM

## 2019-11-18 SURGERY — REPAIR, HERNIA, INGUINAL, ADULT
Anesthesia: General | Laterality: Left

## 2019-11-18 NOTE — Progress Notes (Signed)
Clinical Summary [name] is a 69 y.o.male seen today for follow up of the following medical problems.  1. PAF - reports told he had irregular beat during recent admission at [facility] 1 year ago. 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 No detected recurrences.  - Echo is [facility] care everyewhere - reports occasional episodes of palpitations. Typically occurs with activity. Heart rate increases to high 90s, lasts approx 30 minutes. Only occurs with activity. Feels heart beating faster, mild fatigue. Mild SOB. No lightheadness or dizziness - occurs sporadically, can have 2-3 episodes in a week or every 3 weeks. - started a few years ago, no change in frequency - coffee x1 cup per, no tea, no sodas, no energy drinks, beer 5-6 cans per day bud light. - no significant DOE. Does heavy lifting regularly at work  - complted heart monitor which showed episodes of afib with RVR. We started low dose metoprolol. Since that time no recurrent episodes.  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   - no recent palpitations - no recent SOB, dizziness    2. 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 Preop evaluation - being considered for hernia repair.  - does heavy yard work. Weed eating 1.5 to 2 hours. Can walk up 2 flight stairs without troubles.      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    Past Medical History:  Diagnosis Date  . Atrial fibrillation ([place])   . Colorectal polyps    history of   . Sleep apnea   . Stroke [facility]) 2016     No Known Allergies   Current Outpatient Medications  Medication Sig Dispense Refill  . HYDROcodone-acetaminophen (NORCO) 10-325 MG tablet Take 1-2 tablets by mouth See  admin instructions. Patient states that he takes 2 by mouth at bedtime. Rarely he may ask for another one .    . metoprolol succinate (TOPROL-XL) 25 MG 24 hr tablet Take 25 mg by mouth every morning.    . Omega-3 Fatty Acids (FISH OIL PO) Take 15 mLs by mouth daily. 1 Tbsp by mouth every morning     No current facility-administered medications for this visit.     Past Surgical History:  Procedure Laterality Date  . BRAIN AVM REPAIR  2016  . COLONOSCOPY  02/27/2012   Procedure: COLONOSCOPY;  Surgeon: Rogene Houston, MD;  Location: [facility];  Service: Endoscopy;  Laterality: N/A;  8:30  . HERNIA REPAIR       No Known Allergies    Family History  Problem Relation Age of Onset  . Atrial fibrillation Mother   . Heart disease Father   . Hypertension Sister   . Glaucoma Brother      Social History Mr. Francois reports that he has never smoked. He has never used smokeless tobacco. Mr. Goodin reports current alcohol use of about 6.0 - 8.0 standard drinks of alcohol per week.   Review of Systems CONSTITUTIONAL: No weight loss, fever, chills, weakness or fatigue.  HEENT: Eyes: No visual loss, blurred vision, double vision or yellow sclerae.No hearing loss, sneezing, congestion, runny nose or sore throat.  SKIN: No rash or itching.  CARDIOVASCULAR: per hpi RESPIRATORY: No shortness  of breath, cough or sputum.  GASTROINTESTINAL: No anorexia, nausea, vomiting or diarrhea. No abdominal pain or blood.  GENITOURINARY: No burning on urination, no polyuria NEUROLOGICAL: No headache, dizziness, syncope, paralysis, ataxia, numbness or tingling in the extremities. No change in bowel or bladder control.  MUSCULOSKELETAL: No muscle, back pain, joint pain or stiffness.  LYMPHATICS: No enlarged nodes. No history of splenectomy.  PSYCHIATRIC: No history of depression or anxiety.  ENDOCRINOLOGIC: No reports of sweating, cold or heat intolerance. No polyuria or polydipsia.  Marland Kitchen   Physical  Examination Today's Vitals   11/18/19 1424  BP: 122/80  Pulse: 74  SpO2: 95%  Weight: 201 lb 12.8 oz (91.5 kg)  Height: 6\' 3"  (1.905 m)   Body mass index is 25.22 kg/m.  Gen: resting comfortably, no acute distress HEENT: no scleral icterus, pupils equal round and reactive, no palptable cervical adenopathy,  CV: RRR, no m/r/g, no jvd Resp: Clear to auscultation bilaterally GI: abdomen is soft, non-tender, non-distended, normal bowel sounds, no hepatosplenomegaly MSK: extremities are warm, no edema.  Skin: warm, no rash Neuro:  no focal deficits Psych: appropriate affect   Diagnostic Studies 10/2015 Event monitor  Telemetry strips show sinus rhythm with intermittent episodes of atrial fibrillation with rapid ventricular response to the 160s  No symptoms reported  10/2014 Echo [facility] ECHOCARDIOGRAPHIC DESCRIPTIONS ----------------------------------------------- AORTIC ROOT Size: Normal Dissection: INDETERM FOR DISSECTION  AORTIC VALVE Leaflets: Tricuspid Morphology: Normal Mobility: Fully Mobile  LEFT VENTRICLE Anterior: Normal Size: Normal Lateral: Normal Contraction: Normal Septal: Normal Closest EF: >55% (Estimated) Apical: Normal LV masses: No Masses Inferior: Normal LVH: MILD LVH CONCENTRIC Posterior: Normal Dias.FxClass: RELAXATION ABNORMALITY (GRADE 1) CORRESPONDS TO E/A REVERSAL  MITRAL VALVE Leaflets: Normal Mobility: Fully mobile Morphology: Normal  LEFT ATRIUM Size: Normal LA masses: No masses Normal IAS  MAIN PA Size: Normal  PULMONIC VALVE Morphology: Normal Mobility: Fully Mobile  RIGHT VENTRICLE Size: Normal Free wall: Normal Contraction: Normal RV masses: No Masses  TRICUSPID VALVE Leaflets: Normal Mobility: Fully mobile Morphology: Normal  RIGHT ATRIUM Size: Normal RA Other: None RA masses: No masses  PERICARDIUM Fluid: No effusion  INFERIOR VENACAVA Size: Normal Normal respiratory collapse  DOPPLER ECHO and OTHER SPECIAL  PROCEDURES ------------------------------------ Aortic: No AR No AS  Mitral: No MR No MS MV Inflow E Vel.= nm* cm/s MV Annulus E'Vel.= nm* cm/s E/E'Ratio= nm*  Tricuspid: TRIVIAL TR No TS  Pulmonary: TRIVIAL PR No PS  Other:  INTERPRETATION --------------------------------------------------------------- NORMAL LEFT VENTRICULAR SYSTOLIC FUNCTION WITH MILD LVH NORMAL LA PRESSURES WITH DIASTOLIC DYSFUNCTION NORMAL RIGHT VENTRICULAR SYSTOLIC FUNCTION VALVULAR REGURGITATION: TRIVIAL PR, TRIVIAL TR NO VALVULAR STENOSIS NO PRIOR STUDY FOR COMPARISON    Assessment and Plan  1. PAF - no symptoms, continue toprol - CHADS2Vasc score is 1. Can do either anticoag or nothing, with his prior head bleeding would not commit to anticoag. If risks for stroke goes up over time would need to clear with his neurosurgeon  2. Preoperative evaluation - tolerates greater than 4METs without difficulty - recommend proceeding with hernia surgery       Arnoldo Lenis, M.D.,

## 2019-11-18 NOTE — Patient Instructions (Signed)

## 2019-12-09 ENCOUNTER — Encounter (HOSPITAL_COMMUNITY): Payer: Self-pay

## 2019-12-10 NOTE — H&P (Signed)
Robert Gillespie; [id number]; [birth year]   HPI Patient is a 69 year old white male who was referred to my care by Dr. Karie Kirks for evaluation treatment of a left inguinal hernia.  Patient states he noticed the hernia developing over the past 2 months.  Is made worse with straining.  He is able to reduce it.  He denies any nausea or vomiting.  He currently has 0 out of 10 abdominal pain. Past Medical History:  Diagnosis Date  . Colorectal polyps    history of     Past Surgical History:  Procedure Laterality Date  . COLONOSCOPY  02/27/2012   Procedure: COLONOSCOPY;  Surgeon: Rogene Houston, MD;  Location: [facility];  Service: Endoscopy;  Laterality: N/A;  8:30  . HERNIA REPAIR      Family History  Problem Relation Age of Onset  . Atrial fibrillation Mother   . Heart disease Father   . Hypertension Sister   . Glaucoma Brother     Current Outpatient Medications on File Prior to Visit  Medication Sig Dispense Refill  . HYDROcodone-acetaminophen (NORCO) 10-325 MG tablet Take 1-2 tablets by mouth. Patient states that he takes 2 by mouth at bedtime. Rarely he may ask for another one .    . metoprolol succinate (TOPROL-XL) 25 MG 24 hr tablet Take 25 mg by mouth every morning.     No current facility-administered medications on file prior to visit.    No Known Allergies  Social History   Substance and Sexual Activity  Alcohol Use Yes  . Alcohol/week: 6.0 - 8.0 standard drinks  . Types: 6 - 8 Cans of beer per week   Comment: 6-8 beers a day    Social History   Tobacco Use  Smoking Status Never Smoker  Smokeless Tobacco Never Used    Review of Systems  Constitutional: Negative.   HENT: Negative.   Eyes: Negative.   Respiratory: Negative.   Cardiovascular: Negative.   Gastrointestinal: Negative.   Genitourinary: Negative.   Musculoskeletal: Positive for back pain.  Skin: Negative.   Neurological: Negative.   Endo/Heme/Allergies: Negative.   Psychiatric/Behavioral:  Negative.     Objective   Vitals:   10/29/19 0922  BP: 124/80  Pulse: (!) 105  Temp: 98.4 F (36.9 C)  SpO2: 97%    Physical Exam Vitals reviewed.  Constitutional:      Appearance: Normal appearance. He is normal weight. He is not ill-appearing.  HENT:     Head: Normocephalic and atraumatic.  Cardiovascular:     Rate and Rhythm: Normal rate and regular rhythm.     Heart sounds: Normal heart sounds. No murmur. No friction rub. No gallop.   Pulmonary:     Effort: Pulmonary effort is normal. No respiratory distress.     Breath sounds: Normal breath sounds. No stridor. No wheezing, rhonchi or rales.  Abdominal:     General: Abdomen is flat. Bowel sounds are normal. There is no distension.     Palpations: Abdomen is soft. There is no mass.     Tenderness: There is no abdominal tenderness. There is no guarding or rebound.     Hernia: A hernia is present.     Comments: Reducible left inguinal hernia noted.  Genitourinary:    Comments: Genitourinary examination is within normal limits. Skin:    General: Skin is warm and dry.  Neurological:     Mental Status: He is alert and oriented to person, place, and time.   Primary care notes reviewed  Assessment  Left inguinal hernia Plan   Patient originally had surgery scheduled in June of this year, but cardiology clearance was needed.  Cardiology has cleared the patient for surgery.  The patient will undergo a left inguinal herniorrhaphy with mesh on 12/16/2019.  The risks and benefits of the procedure including bleeding, infection, mesh use, and the possibility of recurrence of the hernia were fully explained to the patient, who gave informed consent.

## 2019-12-10 NOTE — Patient Instructions (Signed)
Your procedure is scheduled on: 12/16/2019  Report to [facility] at   6:15  AM.  Call this number if you have problems the morning of surgery: [phone]   Remember:   Do not Eat or Drink after midnight         No Smoking the morning of surgery  :  Take these medicines the morning of surgery with A SIP OF WATER: Metoprolol   Do not wear jewelry, make-up or nail polish.  Do not wear lotions, powders, or perfumes. You may wear deodorant.  Do not shave 48 hours prior to surgery. Men may shave face and neck.  Do not bring valuables to the hospital.  Contacts, dentures or bridgework may not be worn into surgery.  Leave suitcase in the car. After surgery it may be brought to your room.  For patients admitted to the hospital, checkout time is 11:00 AM the day of discharge.   Patients discharged the day of surgery will not be allowed to drive home.    Special Instructions: Shower using CHG night before surgery and shower the day of surgery use CHG.  Use special wash - you have one bottle of CHG for all showers.  You should use approximately 1/2 of the bottle for each shower.  How to Use Chlorhexidine for Bathing Chlorhexidine gluconate (CHG) is a germ-killing (antiseptic) solution that is used to clean the skin. It can get rid of the bacteria that normally live on the skin and can keep them away for about 24 hours. To clean your skin with CHG, you may be given:  A CHG solution to use in the shower or as part of a sponge bath.  A prepackaged cloth that contains CHG. Cleaning your skin with CHG may help lower the risk for infection:  While you are staying in the intensive care unit of the hospital.  If you have a vascular access, such as a central line, to provide short-term or long-term access to your veins.  If you have a catheter to drain urine from your bladder.  If you are on a ventilator. A ventilator is a machine that helps you breathe by moving air in and out of your  lungs.  After surgery. What are the risks? Risks of using CHG include:  A skin reaction.  Hearing loss, if CHG gets in your ears.  Eye injury, if CHG gets in your eyes and is not rinsed out.  The CHG product catching fire. Make sure that you avoid smoking and flames after applying CHG to your skin. Do not use CHG:  If you have a chlorhexidine allergy or have previously reacted to chlorhexidine.  On babies younger than 71 months of age. How to use CHG solution  Use CHG only as told by your health care provider, and follow the instructions on the label.  Use the full amount of CHG as directed. Usually, this is one bottle. During a shower Follow these steps when using CHG solution during a shower (unless your health care provider gives you different instructions): 1. Start the shower. 2. Use your normal soap and shampoo to wash your face and hair. 3. Turn off the shower or move out of the shower stream. 4. Pour the CHG onto a clean washcloth. Do not use any type of brush or rough-edged sponge. 5. Starting at your neck, lather your body down to your toes. Make sure you follow these instructions: ? If you will be having surgery, pay special attention  to the part of your body where you will be having surgery. Scrub this area for at least 1 minute. ? Do not use CHG on your head or face. If the solution gets into your ears or eyes, rinse them well with water. ? Avoid your genital area. ? Avoid any areas of skin that have broken skin, cuts, or scrapes. ? Scrub your back and under your arms. Make sure to wash skin folds. 6. Let the lather sit on your skin for 1-2 minutes or as long as told by your health care provider. 7. Thoroughly rinse your entire body in the shower. Make sure that all body creases and crevices are rinsed well. 8. Dry off with a clean towel. Do not put any substances on your body afterward--such as powder, lotion, or perfume--unless you are told to do so by your health  care provider. Only use lotions that are recommended by the manufacturer. 9. Put on clean clothes or pajamas. 10. If it is the night before your surgery, sleep in clean sheets.  During a sponge bath Follow these steps when using CHG solution during a sponge bath (unless your health care provider gives you different instructions): 1. Use your normal soap and shampoo to wash your face and hair. 2. Pour the CHG onto a clean washcloth. 3. Starting at your neck, lather your body down to your toes. Make sure you follow these instructions: ? If you will be having surgery, pay special attention to the part of your body where you will be having surgery. Scrub this area for at least 1 minute. ? Do not use CHG on your head or face. If the solution gets into your ears or eyes, rinse them well with water. ? Avoid your genital area. ? Avoid any areas of skin that have broken skin, cuts, or scrapes. ? Scrub your back and under your arms. Make sure to wash skin folds. 4. Let the lather sit on your skin for 1-2 minutes or as long as told by your health care provider. 5. Using a different clean, wet washcloth, thoroughly rinse your entire body. Make sure that all body creases and crevices are rinsed well. 6. Dry off with a clean towel. Do not put any substances on your body afterward--such as powder, lotion, or perfume--unless you are told to do so by your health care provider. Only use lotions that are recommended by the manufacturer. 7. Put on clean clothes or pajamas. 8. If it is the night before your surgery, sleep in clean sheets. How to use CHG prepackaged cloths  Only use CHG cloths as told by your health care provider, and follow the instructions on the label.  Use the CHG cloth on clean, dry skin.  Do not use the CHG cloth on your head or face unless your health care provider tells you to.  When washing with the CHG cloth: ? Avoid your genital area. ? Avoid any areas of skin that have broken  skin, cuts, or scrapes. Before surgery Follow these steps when using a CHG cloth to clean before surgery (unless your health care provider gives you different instructions): 1. Using the CHG cloth, vigorously scrub the part of your body where you will be having surgery. Scrub using a back-and-forth motion for 3 minutes. The area on your body should be completely wet with CHG when you are done scrubbing. 2. Do not rinse. Discard the cloth and let the area air-dry. Do not put any substances on the area afterward,  such as powder, lotion, or perfume. 3. Put on clean clothes or pajamas. 4. If it is the night before your surgery, sleep in clean sheets.  For general bathing Follow these steps when using CHG cloths for general bathing (unless your health care provider gives you different instructions). 1. Use a separate CHG cloth for each area of your body. Make sure you wash between any folds of skin and between your fingers and toes. Wash your body in the following order, switching to a new cloth after each step: ? The front of your neck, shoulders, and chest. ? Both of your arms, under your arms, and your hands. ? Your stomach and groin area, avoiding the genitals. ? Your right leg and foot. ? Your left leg and foot. ? The back of your neck, your back, and your buttocks. 2. Do not rinse. Discard the cloth and let the area air-dry. Do not put any substances on your body afterward--such as powder, lotion, or perfume--unless you are told to do so by your health care provider. Only use lotions that are recommended by the manufacturer. 3. Put on clean clothes or pajamas. Contact a health care provider if:  Your skin gets irritated after scrubbing.  You have questions about using your solution or cloth. Get help right away if:  Your eyes become very red or swollen.  Your eyes itch badly.  Your skin itches badly and is red or swollen.  Your hearing changes.  You have trouble seeing.  You have  swelling or tingling in your mouth or throat.  You have trouble breathing.  You swallow any chlorhexidine. Summary  Chlorhexidine gluconate (CHG) is a germ-killing (antiseptic) solution that is used to clean the skin. Cleaning your skin with CHG may help to lower your risk for infection.  You may be given CHG to use for bathing. It may be in a bottle or in a prepackaged cloth to use on your skin. Carefully follow your health care provider's instructions and the instructions on the product label.  Do not use CHG if you have a chlorhexidine allergy.  Contact your health care provider if your skin gets irritated after scrubbing. This information is not intended to replace advice given to you by your health care provider. Make sure you discuss any questions you have with your health care provider. Document Revised: 08/14/2018 Document Reviewed: 04/25/2017 Elsevier Patient Education  [place].  Inguinal Hernia, Adult An inguinal hernia develops when fat or the intestines push through a weak spot in a muscle where your leg meets your lower abdomen (groin). This creates a bulge. This kind of hernia could also be:  In your scrotum, if you are male.  In folds of skin around your vagina, if you are male. There are three types of inguinal hernias:  Hernias that can be pushed back into the abdomen (are reducible). This type rarely causes pain.  Hernias that are not reducible (are incarcerated).  Hernias that are not reducible and lose their blood supply (are strangulated). This type of hernia requires emergency surgery. What are the causes? This condition is caused by having a weak spot in the muscles or tissues in the groin. This weak spot develops over time. The hernia may poke through the weak spot when you suddenly strain your lower abdominal muscles, such as when you:  Lift a heavy object.  Strain to have a bowel movement. Constipation can lead to straining.  Cough. What  increases the risk? This condition is  more likely to develop in:  Men.  Pregnant women.  People who: ? Are overweight. ? Work in jobs that require long periods of standing or heavy lifting. ? Have had an inguinal hernia before. ? Smoke or have lung disease. These factors can lead to long-lasting (chronic) coughing. What are the signs or symptoms? Symptoms may depend on the size of the hernia. Often, a small inguinal hernia has no symptoms. Symptoms of a larger hernia may include:  A lump in the groin area. This is easier to see when standing. It might not be visible when lying down.  Pain or burning in the groin. This may get worse when lifting, straining, or coughing.  A dull ache or a feeling of pressure in the groin.  In men, an unusual lump in the scrotum. Symptoms of a strangulated inguinal hernia may include:  A bulge in your groin that is very painful and tender to the touch.  A bulge that turns red or purple.  Fever, nausea, and vomiting.  Inability to have a bowel movement or to pass gas. How is this diagnosed? This condition is diagnosed based on your symptoms, your medical history, and a physical exam. Your health care provider may feel your groin area and ask you to cough. How is this treated? Treatment depends on the size of your hernia and whether you have symptoms. If you do not have symptoms, your health care provider may have you watch your hernia carefully and have you come in for follow-up visits. If your hernia is large or if you have symptoms, you may need surgery to repair the hernia. Follow these instructions at home: Lifestyle  Avoid lifting heavy objects.  Avoid standing for long periods of time.  Do not use any products that contain nicotine or tobacco, such as cigarettes and e-cigarettes. If you need help quitting, ask your health care provider.  Maintain a healthy weight. Preventing constipation  Take actions to prevent constipation.  Constipation leads to straining with bowel movements, which can make a hernia worse or cause a hernia repair to break down. Your health care provider may recommend that you: ? Drink enough fluid to keep your urine pale yellow. ? Eat foods that are high in fiber, such as fresh fruits and vegetables, whole grains, and beans. ? Limit foods that are high in fat and processed sugars, such as fried or sweet foods. ? Take an over-the-counter or prescription medicine for constipation. General instructions  You may try to push the hernia back in place by very gently pressing on it while lying down. Do not try to force the bulge back in if it will not push in easily.  Watch your hernia for any changes in shape, size, or color. Get help right away if you notice any changes.  Take over-the-counter and prescription medicines only as told by your health care provider.  Keep all follow-up visits as told by your health care provider. This is important. Contact a health care provider if:  You have a fever.  You develop new symptoms.  Your symptoms get worse. Get help right away if:  You have pain in your groin that suddenly gets worse.  You have a bulge in your groin that: ? Suddenly gets bigger and does not get smaller. ? Becomes red or purple or painful to the touch.  You are a man and you have a sudden pain in your scrotum, or the size of your scrotum suddenly changes.  You cannot push  the hernia back in place by very gently pressing on it when you are lying down. Do not try to force the bulge back in if it will not push in easily.  You have nausea or vomiting that does not go away.  You have a fast heartbeat.  You cannot have a bowel movement or pass gas. These symptoms may represent a serious problem that is an emergency. Do not wait to see if the symptoms will go away. Get medical help right away. Call your local emergency services (911 in the [place] Summary  An inguinal hernia develops  when fat or the intestines push through a weak spot in a muscle where your leg meets your lower abdomen (groin).  This condition is caused by having a weak spot in muscles or tissue in your groin.  Symptoms may depend on the size of the hernia, and they may include pain or swelling in your groin. A small inguinal hernia often has no symptoms.  Treatment may not be needed if you do not have symptoms. If you have symptoms or a large hernia, you may need surgery to repair the hernia.  Avoid lifting heavy objects. Also avoid standing for long amounts of time. This information is not intended to replace advice given to you by your health care provider. Make sure you discuss any questions you have with your health care provider. Document Revised: 06/29/2017 Document Reviewed: 02/27/2017 Elsevier Patient Education  [place] Repair, Adult, Care After These instructions give you information about caring for yourself after your procedure. Your doctor may also give you more specific instructions. If you have problems or questions, contact your doctor. Follow these instructions at home: Surgical cut (incision) care   Follow instructions from your doctor about how to take care of your surgical cut area. Make sure you: ? Wash your hands with soap and water before you change your bandage (dressing). If you cannot use soap and water, use hand sanitizer. ? Change your bandage as told by your doctor. ? Leave stitches (sutures), skin glue, or skin tape (adhesive) strips in place. They may need to stay in place for 2 weeks or longer. If tape strips get loose and curl up, you may trim the loose edges. Do not remove tape strips completely unless your doctor says it is okay.  Check your surgical cut every day for signs of infection. Check for: ? More redness, swelling, or pain. ? More fluid or blood. ? Warmth. ? Pus or a bad smell. Activity  Do not drive or use heavy machinery while  taking prescription pain medicine. Do not drive until your doctor says it is okay.  Until your doctor says it is okay: ? Do not lift anything that is heavier than 10 lb (4.5 kg). ? Do not play contact sports.  Return to your normal activities as told by your doctor. Ask your doctor what activities are safe. General instructions  To prevent or treat having a hard time pooping (constipation) while you are taking prescription pain medicine, your doctor may recommend that you: ? Drink enough fluid to keep your pee (urine) clear or pale yellow. ? Take over-the-counter or prescription medicines. ? Eat foods that are high in fiber, such as fresh fruits and vegetables, whole grains, and beans. ? Limit foods that are high in fat and processed sugars, such as fried and sweet foods.  Take over-the-counter and prescription medicines only as told by your doctor.  Do not  take baths, swim, or use a hot tub until your doctor says it is okay.  Keep all follow-up visits as told by your doctor. This is important. Contact a doctor if:  You develop a rash.  You have more redness, swelling, or pain around your surgical cut.  You have more fluid or blood coming from your surgical cut.  Your surgical cut feels warm to the touch.  You have pus or a bad smell coming from your surgical cut.  You have a fever or chills.  You have blood in your poop (stool).  You have not pooped in 2-3 days.  Medicine does not help your pain. Get help right away if:  You have chest pain or you are short of breath.  You feel light-headed.  You feel weak and dizzy (feel faint).  You have very bad pain.  You throw up (vomit) and your pain is worse. This information is not intended to replace advice given to you by your health care provider. Make sure you discuss any questions you have with your health care provider. Document Revised: 09/19/2018 Document Reviewed: 11/09/2015 Elsevier Patient Education  2020  [place] Anesthesia, Adult, Care After This sheet gives you information about how to care for yourself after your procedure. Your health care provider may also give you more specific instructions. If you have problems or questions, contact your health care provider. What can I expect after the procedure? After the procedure, the following side effects are common:  Pain or discomfort at the IV site.  Nausea.  Vomiting.  Sore throat.  Trouble concentrating.  Feeling cold or chills.  Weak or tired.  Sleepiness and fatigue.  Soreness and body aches. These side effects can affect parts of the body that were not involved in surgery. Follow these instructions at home:  For at least 24 hours after the procedure:  Have a responsible adult stay with you. It is important to have someone help care for you until you are awake and alert.  Rest as needed.  Do not: ? Participate in activities in which you could fall or become injured. ? Drive. ? Use heavy machinery. ? Drink alcohol. ? Take sleeping pills or medicines that cause drowsiness. ? Make important decisions or sign legal documents. ? Take care of children on your own. Eating and drinking  Follow any instructions from your health care provider about eating or drinking restrictions.  When you feel hungry, start by eating small amounts of foods that are soft and easy to digest (bland), such as toast. Gradually return to your regular diet.  Drink enough fluid to keep your urine pale yellow.  If you vomit, rehydrate by drinking water, juice, or clear broth. General instructions  If you have sleep apnea, surgery and certain medicines can increase your risk for breathing problems. Follow instructions from your health care provider about wearing your sleep device: ? Anytime you are sleeping, including during daytime naps. ? While taking prescription pain medicines, sleeping medicines, or medicines that make you  drowsy.  Return to your normal activities as told by your health care provider. Ask your health care provider what activities are safe for you.  Take over-the-counter and prescription medicines only as told by your health care provider.  If you smoke, do not smoke without supervision.  Keep all follow-up visits as told by your health care provider. This is important. Contact a health care provider if:  You have nausea or vomiting that does not  get better with medicine.  You cannot eat or drink without vomiting.  You have pain that does not get better with medicine.  You are unable to pass urine.  You develop a skin rash.  You have a fever.  You have redness around your IV site that gets worse. Get help right away if:  You have difficulty breathing.  You have chest pain.  You have blood in your urine or stool, or you vomit blood. Summary  After the procedure, it is common to have a sore throat or nausea. It is also common to feel tired.  Have a responsible adult stay with you for the first 24 hours after general anesthesia. It is important to have someone help care for you until you are awake and alert.  When you feel hungry, start by eating small amounts of foods that are soft and easy to digest (bland), such as toast. Gradually return to your regular diet.  Drink enough fluid to keep your urine pale yellow.  Return to your normal activities as told by your health care provider. Ask your health care provider what activities are safe for you. This information is not intended to replace advice given to you by your health care provider. Make sure you discuss any questions you have with your health care provider. Document Revised: 05/31/2017 Document Reviewed: 01/11/2017 Elsevier Patient Education  [place].

## 2019-12-15 ENCOUNTER — Encounter (HOSPITAL_COMMUNITY): Payer: Self-pay

## 2019-12-15 ENCOUNTER — Other Ambulatory Visit (HOSPITAL_COMMUNITY)
Admission: RE | Admit: 2019-12-15 | Discharge: 2019-12-15 | Disposition: A | Discharge status: Home / Self Care | Payer: Medicare Other | Source: Ambulatory Visit | Attending: General Surgery | Admitting: General Surgery

## 2019-12-15 ENCOUNTER — Other Ambulatory Visit: Payer: Self-pay

## 2019-12-15 ENCOUNTER — Encounter (HOSPITAL_COMMUNITY)
Admission: RE | Admit: 2019-12-15 | Discharge: 2019-12-15 | Disposition: A | Discharge status: Home / Self Care | Payer: Medicare Other | Source: Ambulatory Visit | Attending: General Surgery | Admitting: General Surgery

## 2019-12-15 DIAGNOSIS — Z20822 Contact with and (suspected) exposure to covid-19: Secondary | ICD-10-CM | POA: Diagnosis not present

## 2019-12-15 DIAGNOSIS — Z01812 Encounter for preprocedural laboratory examination: Secondary | ICD-10-CM | POA: Diagnosis not present

## 2019-12-15 LAB — CBC WITH DIFFERENTIAL/PLATELET
Abs Immature Granulocytes: 0.01 10*3/uL (ref 0.00–0.07)
Basophils Absolute: 0 10*3/uL (ref 0.0–0.1)
Basophils Relative: 0 %
Eosinophils Absolute: 0.1 10*3/uL (ref 0.0–0.5)
Eosinophils Relative: 2 %
HCT: 45.2 % (ref 39.0–52.0)
Hemoglobin: 14.9 g/dL (ref 13.0–17.0)
Immature Granulocytes: 0 %
Lymphocytes Relative: 30 %
Lymphs Abs: 1.8 10*3/uL (ref 0.7–4.0)
MCH: 34.8 pg — ABNORMAL HIGH (ref 26.0–34.0)
MCHC: 33 g/dL (ref 30.0–36.0)
MCV: 105.6 fL — ABNORMAL HIGH (ref 80.0–100.0)
Monocytes Absolute: 0.7 10*3/uL (ref 0.1–1.0)
Monocytes Relative: 12 %
Neutro Abs: 3.4 10*3/uL (ref 1.7–7.7)
Neutrophils Relative %: 56 %
Platelets: 172 10*3/uL (ref 150–400)
RBC: 4.28 MIL/uL (ref 4.22–5.81)
RDW: 13.1 % (ref 11.5–15.5)
WBC: 6.1 10*3/uL (ref 4.0–10.5)
nRBC: 0 % (ref 0.0–0.2)

## 2019-12-15 LAB — BASIC METABOLIC PANEL
Anion gap: 8 (ref 5–15)
BUN: 14 mg/dL (ref 8–23)
CO2: 28 mmol/L (ref 22–32)
Calcium: 9.3 mg/dL (ref 8.9–10.3)
Chloride: 103 mmol/L (ref 98–111)
Creatinine, Ser: 0.65 mg/dL (ref 0.61–1.24)
GFR calc Af Amer: >60 mL/min (ref >60)
GFR calc non Af Amer: >60 mL/min (ref >60)
Glucose, Bld: 56 mg/dL — ABNORMAL LOW (ref 70–99)
Potassium: 4.7 mmol/L (ref 3.5–5.1)
Sodium: 139 mmol/L (ref 135–145)

## 2019-12-15 LAB — PROTIME-INR
INR: 0.9 (ref 0.8–1.2)
Prothrombin Time: 12.1 seconds (ref 11.4–15.2)

## 2019-12-15 LAB — SARS CORONAVIRUS 2 (TAT 6-24 HRS): SARS Coronavirus 2: NEGATIVE

## 2019-12-16 ENCOUNTER — Ambulatory Visit (HOSPITAL_COMMUNITY): Payer: Medicare Other | Admitting: Anesthesiology

## 2019-12-16 ENCOUNTER — Ambulatory Visit (HOSPITAL_COMMUNITY)
Admission: RE | Admit: 2019-12-16 | Discharge: 2019-12-16 | Disposition: A | Discharge status: Home / Self Care | Payer: Medicare Other | Attending: General Surgery | Admitting: General Surgery

## 2019-12-16 ENCOUNTER — Encounter (HOSPITAL_COMMUNITY): Payer: Self-pay | Admitting: General Surgery

## 2019-12-16 ENCOUNTER — Encounter (HOSPITAL_COMMUNITY)
Admission: RE | Disposition: A | Discharge status: Home / Self Care | Payer: Self-pay | Source: Home / Self Care | Attending: General Surgery

## 2019-12-16 DIAGNOSIS — K4090 Unilateral inguinal hernia, without obstruction or gangrene, not specified as recurrent: Secondary | ICD-10-CM

## 2019-12-16 DIAGNOSIS — Z79899 Other long term (current) drug therapy: Secondary | ICD-10-CM | POA: Diagnosis not present

## 2019-12-16 DIAGNOSIS — D176 Benign lipomatous neoplasm of spermatic cord: Secondary | ICD-10-CM | POA: Diagnosis not present

## 2019-12-16 DIAGNOSIS — Z8249 Family history of ischemic heart disease and other diseases of the circulatory system: Secondary | ICD-10-CM | POA: Diagnosis not present

## 2019-12-16 DIAGNOSIS — Z86010 Personal history of colonic polyps: Secondary | ICD-10-CM | POA: Insufficient documentation

## 2019-12-16 DIAGNOSIS — Z8673 Personal history of transient ischemic attack (TIA), and cerebral infarction without residual deficits: Secondary | ICD-10-CM | POA: Diagnosis not present

## 2019-12-16 DIAGNOSIS — Z83511 Family history of glaucoma: Secondary | ICD-10-CM | POA: Diagnosis not present

## 2019-12-16 DIAGNOSIS — G4730 Sleep apnea, unspecified: Secondary | ICD-10-CM | POA: Diagnosis not present

## 2019-12-16 DIAGNOSIS — I4891 Unspecified atrial fibrillation: Secondary | ICD-10-CM | POA: Diagnosis not present

## 2019-12-16 HISTORY — PX: INGUINAL HERNIA REPAIR: SHX194

## 2019-12-16 SURGERY — REPAIR, HERNIA, INGUINAL, ADULT
Anesthesia: General | Site: Inguinal | Laterality: Left

## 2019-12-16 MED ORDER — DEXAMETHASONE SODIUM PHOSPHATE 4 MG/ML IJ SOLN
INTRAMUSCULAR | Status: DC | PRN
  Administered 2019-12-16: 4 mg via INTRAVENOUS

## 2019-12-16 MED ORDER — MIDAZOLAM HCL 5 MG/5ML IJ SOLN
INTRAMUSCULAR | Status: DC | PRN
  Administered 2019-12-16: 2 mg via INTRAVENOUS

## 2019-12-16 MED ORDER — PROPOFOL 10 MG/ML IV BOLUS
INTRAVENOUS | Status: DC | PRN
  Administered 2019-12-16: 150 mg via INTRAVENOUS

## 2019-12-16 MED ORDER — SUCCINYLCHOLINE CHLORIDE 200 MG/10ML IV SOSY
PREFILLED_SYRINGE | INTRAVENOUS | Status: AC
  Filled 2019-12-16: qty 10

## 2019-12-16 MED ORDER — FENTANYL CITRATE (PF) 100 MCG/2ML IJ SOLN
INTRAMUSCULAR | Status: DC | PRN
  Administered 2019-12-16: 100 ug via INTRAVENOUS

## 2019-12-16 MED ORDER — ROCURONIUM BROMIDE 10 MG/ML (PF) SYRINGE
PREFILLED_SYRINGE | INTRAVENOUS | Status: AC
  Filled 2019-12-16: qty 10

## 2019-12-16 MED ORDER — LACTATED RINGERS IV SOLN: INTRAVENOUS | Status: DC | PRN

## 2019-12-16 MED ORDER — ROCURONIUM BROMIDE 100 MG/10ML IV SOLN
INTRAVENOUS | Status: DC | PRN
Start: 2019-12-16 — End: 2019-12-16
  Administered 2019-12-16: 40 mg via INTRAVENOUS

## 2019-12-16 MED ORDER — PROMETHAZINE HCL 25 MG/ML IJ SOLN: 6.2500 mg | INTRAMUSCULAR | Status: DC | PRN

## 2019-12-16 MED ORDER — LIDOCAINE 2% (20 MG/ML) 5 ML SYRINGE
INTRAMUSCULAR | Status: AC
  Filled 2019-12-16: qty 5

## 2019-12-16 MED ORDER — SODIUM CHLORIDE 0.9 % IR SOLN
Status: DC | PRN
  Administered 2019-12-16: 1000 mL

## 2019-12-16 MED ORDER — MIDAZOLAM HCL 2 MG/2ML IJ SOLN
INTRAMUSCULAR | Status: AC
  Filled 2019-12-16: qty 2

## 2019-12-16 MED ORDER — DEXAMETHASONE SODIUM PHOSPHATE 10 MG/ML IJ SOLN
INTRAMUSCULAR | Status: AC
  Filled 2019-12-16: qty 1

## 2019-12-16 MED ORDER — ORAL CARE MOUTH RINSE: 15.0000 mL | Freq: Once | OROMUCOSAL | Status: AC

## 2019-12-16 MED ORDER — SUGAMMADEX SODIUM 200 MG/2ML IV SOLN
INTRAVENOUS | Status: DC | PRN
Start: 2019-12-16 — End: 2019-12-16
  Administered 2019-12-16: 200 mg via INTRAVENOUS

## 2019-12-16 MED ORDER — CHLORHEXIDINE GLUCONATE CLOTH 2 % EX PADS: 6.0000 | MEDICATED_PAD | Freq: Once | CUTANEOUS | Status: DC

## 2019-12-16 MED ORDER — KETOROLAC TROMETHAMINE 15 MG/ML IJ SOLN
INTRAMUSCULAR | Status: AC
  Filled 2019-12-16: qty 1

## 2019-12-16 MED ORDER — PROPOFOL 10 MG/ML IV BOLUS
INTRAVENOUS | Status: AC
  Filled 2019-12-16: qty 20

## 2019-12-16 MED ORDER — BUPIVACAINE LIPOSOME 1.3 % IJ SUSP
INTRAMUSCULAR | Status: DC | PRN
  Administered 2019-12-16: 20 mL

## 2019-12-16 MED ORDER — SUCCINYLCHOLINE CHLORIDE 20 MG/ML IJ SOLN
INTRAMUSCULAR | Status: DC | PRN
  Administered 2019-12-16: 140 mg via INTRAVENOUS

## 2019-12-16 MED ORDER — ONDANSETRON HCL 4 MG/2ML IJ SOLN
INTRAMUSCULAR | Status: AC
  Filled 2019-12-16: qty 2

## 2019-12-16 MED ORDER — FENTANYL CITRATE (PF) 100 MCG/2ML IJ SOLN
INTRAMUSCULAR | Status: AC
  Filled 2019-12-16: qty 2

## 2019-12-16 MED ORDER — HYDROMORPHONE HCL 1 MG/ML IJ SOLN
0.2500 mg | INTRAMUSCULAR | Status: DC | PRN
  Administered 2019-12-16 (×2): 0.5 mg via INTRAVENOUS
  Filled 2019-12-16 (×2): qty 0.5

## 2019-12-16 MED ORDER — KETOROLAC TROMETHAMINE 30 MG/ML IJ SOLN
15.0000 mg | Freq: Once | INTRAMUSCULAR | Status: AC
  Administered 2019-12-16: 15 mg via INTRAVENOUS
  Filled 2019-12-16: qty 1

## 2019-12-16 MED ORDER — LIDOCAINE HCL (CARDIAC) PF 100 MG/5ML IV SOSY
PREFILLED_SYRINGE | INTRAVENOUS | Status: DC | PRN
  Administered 2019-12-16: 100 mg via INTRAVENOUS

## 2019-12-16 MED ORDER — BUPIVACAINE LIPOSOME 1.3 % IJ SUSP
INTRAMUSCULAR | Status: AC
  Filled 2019-12-16: qty 20

## 2019-12-16 MED ORDER — ONDANSETRON HCL 4 MG/2ML IJ SOLN
INTRAMUSCULAR | Status: DC | PRN
  Administered 2019-12-16: 4 mg via INTRAVENOUS

## 2019-12-16 MED ORDER — MEPERIDINE HCL 50 MG/ML IJ SOLN: 6.2500 mg | INTRAMUSCULAR | Status: DC | PRN

## 2019-12-16 MED ORDER — CHLORHEXIDINE GLUCONATE 0.12 % MT SOLN
15.0000 mL | Freq: Once | OROMUCOSAL | Status: AC
  Administered 2019-12-16: 15 mL via OROMUCOSAL

## 2019-12-16 MED ORDER — LACTATED RINGERS IV SOLN: Freq: Once | INTRAVENOUS | Status: AC

## 2019-12-16 MED ORDER — CEFAZOLIN SODIUM-DEXTROSE 2-4 GM/100ML-% IV SOLN
2.0000 g | INTRAVENOUS | Status: AC
  Administered 2019-12-16: 2 g via INTRAVENOUS
  Filled 2019-12-16: qty 100

## 2019-12-16 SURGICAL SUPPLY — 37 items
ADH SKN CLS APL DERMABOND .7 (GAUZE/BANDAGES/DRESSINGS) ×1
CLOTH BEACON ORANGE TIMEOUT ST (SAFETY) ×2 IMPLANT
COVER LIGHT HANDLE STERIS (MISCELLANEOUS) ×4 IMPLANT
COVER WAND RF STERILE (DRAPES) ×2 IMPLANT
DERMABOND ADVANCED (GAUZE/BANDAGES/DRESSINGS) ×1
DERMABOND ADVANCED .7 DNX12 (GAUZE/BANDAGES/DRESSINGS) ×1 IMPLANT
DRAIN PENROSE 0.5X18 (DRAIN) ×2 IMPLANT
ELECT REM PT RETURN 9FT ADLT (ELECTROSURGICAL) ×2
ELECTRODE REM PT RTRN 9FT ADLT (ELECTROSURGICAL) ×1 IMPLANT
GAUZE SPONGE 4X4 12PLY STRL (GAUZE/BANDAGES/DRESSINGS) ×2 IMPLANT
GLOVE BIO SURGEON STRL SZ 6.5 (GLOVE) ×1 IMPLANT
GLOVE BIO SURGEON STRL SZ7 (GLOVE) ×1 IMPLANT
GLOVE BIOGEL PI IND STRL 7.0 (GLOVE) ×3 IMPLANT
GLOVE BIOGEL PI INDICATOR 7.0 (GLOVE) ×3
GLOVE SURG SS PI 7.5 STRL IVOR (GLOVE) ×2 IMPLANT
GOWN STRL REUS W/TWL LRG LVL3 (GOWN DISPOSABLE) ×6 IMPLANT
INST SET MINOR GENERAL (KITS) ×2 IMPLANT
KIT TURNOVER KIT A (KITS) ×2 IMPLANT
MANIFOLD NEPTUNE II (INSTRUMENTS) ×2 IMPLANT
MESH HERNIA 1.6X1.9 PLUG LRG (Mesh General) IMPLANT
MESH HERNIA PLUG LRG (Mesh General) ×1 IMPLANT
NDL HYPO 18GX1.5 BLUNT FILL (NEEDLE) ×1 IMPLANT
NEEDLE HYPO 18GX1.5 BLUNT FILL (NEEDLE) ×2 IMPLANT
NEEDLE HYPO 22GX1.5 SAFETY (NEEDLE) ×2 IMPLANT
NS IRRIG 1000ML POUR BTL (IV SOLUTION) ×2 IMPLANT
PACK MINOR (CUSTOM PROCEDURE TRAY) ×1 IMPLANT
PAD ARMBOARD 7.5X6 YLW CONV (MISCELLANEOUS) ×2 IMPLANT
PENCIL SMOKE EVACUATOR (MISCELLANEOUS) ×2 IMPLANT
SET BASIN LINEN APH (SET/KITS/TRAYS/PACK) ×2 IMPLANT
SOL PREP PROV IODINE SCRUB 4OZ (MISCELLANEOUS) ×2 IMPLANT
SUT MNCRL AB 4-0 PS2 18 (SUTURE) ×2 IMPLANT
SUT NOVA NAB GS-22 2 2-0 T-19 (SUTURE) ×5 IMPLANT
SUT VIC AB 2-0 CT1 27 (SUTURE) ×2
SUT VIC AB 2-0 CT1 TAPERPNT 27 (SUTURE) ×1 IMPLANT
SUT VIC AB 3-0 SH 27 (SUTURE) ×2
SUT VIC AB 3-0 SH 27X BRD (SUTURE) ×1 IMPLANT
SYR 20ML LL LF (SYRINGE) ×4 IMPLANT

## 2019-12-16 NOTE — Op Note (Signed)
Patient:  Robert Gillespie  DOB:  February 24, 1951  MRN:  [id number]   Preop Diagnosis: Left inguinal hernia  Postop Diagnosis: Same  Procedure: Left inguinal herniorrhaphy with mesh  Surgeon: Aviva Signs, MD  Anes: General  Indications: Patient is a 69 year old white male who presents with a symptomatic left inguinal hernia.  The risks and benefits of the procedure including bleeding, infection, mesh use, and the possibility of recurrence of the hernia were fully explained to the patient, who gave informed consent.  Procedure note: The patient was placed in supine position.  After general anesthesia was administered, the left groin region was prepped and draped using the usual sterile technique with Betadine.  Surgical site confirmation was performed.  An incision was made in the left groin region down to the external oblique aponeurosis.  The aponeurosis was incised to the external ring.  A Penrose drain was placed around the spermatic cord.  The vas deferens was noted within the spermatic cord.  A small lipoma of the cord was excised.  The patient had an indirect hernia.  This was freed away from the spermatic cord up to the peritoneal reflection and inverted.  A large Bard PerFix plug was then inserted.  An onlay patch was placed along the floor of the inguinal canal and secured superiorly to the conjoined tendon and inferiorly to the shelving edge of Poupart's ligament using 2-0 Novafil interrupted sutures.  The internal ring was recreated using a 2-0 Novafil interrupted suture.  The external oblique aponeurosis was reapproximated using a 2-0 Vicryl running suture.  Subcutaneous layer was reapproximated using a 3-0 Vicryl interrupted suture.  Exparel was instilled into the surrounding wound.  The skin was closed using a 4-0 Monocryl subcuticular suture.  Dermabond was applied.  All tape and needle counts were correct at the end of the procedure.  The patient was awakened and transferred to PACU  in stable condition.  Complications: None  EBL: Minimal  Specimen: None

## 2019-12-16 NOTE — Anesthesia Preprocedure Evaluation (Signed)
Anesthesia Evaluation  Patient identified by MRN, date of birth, ID band Patient awake    Reviewed: Allergy & Precautions, NPO status , Patient's Chart, lab work & pertinent test results, reviewed documented beta blocker date and time   History of Anesthesia Complications Negative for: history of anesthetic complications  Airway Mallampati: III  TM Distance: >3 FB Neck ROM: Full    Dental  (+) Dental Advisory Given, Caps, Teeth Intact   Pulmonary sleep apnea ,    Pulmonary exam normal breath sounds clear to auscultation       Cardiovascular Exercise Tolerance: Good hypertension, Pt. on medications and Pt. on home beta blockers + dysrhythmias Atrial Fibrillation  Rhythm:Irregular Rate:Normal - Systolic murmurs, - Diastolic murmurs, - Friction Rub, - Carotid Bruit, - Peripheral Edema and - Systolic Click [id number] 26:71:24 [facility] ROUTINE RECORD Atrial fibrillation Abnormal ECG Since last tracing Atrial fibrillation is new Confirmed by Fransico Him ([id number]) on 11/16/2019 10:51:38 PM   Neuro/Psych Brain AVM repair 2016 CVA negative psych ROS   GI/Hepatic negative GI ROS, (+)     substance abuse  alcohol use,   Endo/Other  negative endocrine ROS  Renal/GU negative Renal ROS  negative genitourinary   Musculoskeletal negative musculoskeletal ROS (+)   Abdominal   Peds  Hematology negative hematology ROS (+)   Anesthesia Other Findings   Reproductive/Obstetrics negative OB ROS                            Anesthesia Physical Anesthesia Plan  ASA: III  Anesthesia Plan: General   Post-op Pain Management:    Induction: Intravenous  PONV Risk Score and Plan: 3 and Ondansetron, Dexamethasone and Midazolam  Airway Management Planned: Oral ETT  Additional Equipment:   Intra-op Plan:   Post-operative Plan:   Informed Consent: I have reviewed the patients  History and Physical, chart, labs and discussed the procedure including the risks, benefits and alternatives for the proposed anesthesia with the patient or authorized representative who has indicated his/her understanding and acceptance.     Dental advisory given  Plan Discussed with: CRNA and Surgeon  Anesthesia Plan Comments:        Anesthesia Quick Evaluation

## 2019-12-16 NOTE — Discharge Instructions (Signed)
General Anesthesia, Adult, Care After This sheet gives you information about how to care for yourself after your procedure. Your health care provider may also give you more specific instructions. If you have problems or questions, contact your health care provider. What can I expect after the procedure? After the procedure, the following side effects are common:  Pain or discomfort at the IV site.  Nausea.  Vomiting.  Sore throat.  Trouble concentrating.  Feeling cold or chills.  Weak or tired.  Sleepiness and fatigue.  Soreness and body aches. These side effects can affect parts of the body that were not involved in surgery. Follow these instructions at home:  For at least 24 hours after the procedure:  Have a responsible adult stay with you. It is important to have someone help care for you until you are awake and alert.  Rest as needed.  Do not: ? Participate in activities in which you could fall or become injured. ? Drive. ? Use heavy machinery. ? Drink alcohol. ? Take sleeping pills or medicines that cause drowsiness. ? Make important decisions or sign legal documents. ? Take care of children on your own. Eating and drinking  Follow any instructions from your health care provider about eating or drinking restrictions.  When you feel hungry, start by eating small amounts of foods that are soft and easy to digest (bland), such as toast. Gradually return to your regular diet.  Drink enough fluid to keep your urine pale yellow.  If you vomit, rehydrate by drinking water, juice, or clear broth. General instructions  If you have sleep apnea, surgery and certain medicines can increase your risk for breathing problems. Follow instructions from your health care provider about wearing your sleep device: ? Anytime you are sleeping, including during daytime naps. ? While taking prescription pain medicines, sleeping medicines, or medicines that make you drowsy.  Return to  your normal activities as told by your health care provider. Ask your health care provider what activities are safe for you.  Take over-the-counter and prescription medicines only as told by your health care provider.  If you smoke, do not smoke without supervision.  Keep all follow-up visits as told by your health care provider. This is important. Contact a health care provider if:  You have nausea or vomiting that does not get better with medicine.  You cannot eat or drink without vomiting.  You have pain that does not get better with medicine.  You are unable to pass urine.  You develop a skin rash.  You have a fever.  You have redness around your IV site that gets worse. Get help right away if:  You have difficulty breathing.  You have chest pain.  You have blood in your urine or stool, or you vomit blood. Summary  After the procedure, it is common to have a sore throat or nausea. It is also common to feel tired.  Have a responsible adult stay with you for the first 24 hours after general anesthesia. It is important to have someone help care for you until you are awake and alert.  When you feel hungry, start by eating small amounts of foods that are soft and easy to digest (bland), such as toast. Gradually return to your regular diet.  Drink enough fluid to keep your urine pale yellow.  Return to your normal activities as told by your health care provider. Ask your health care provider what activities are safe for you. This information is not   intended to replace advice given to you by your health care provider. Make sure you discuss any questions you have with your health care provider. Document Revised: 05/31/2017 Document Reviewed: 01/11/2017 Elsevier Patient Education  [place] [facility], Adult, Care After This sheet gives you information about how to care for yourself after your procedure. Your health care provider may also give you more  specific instructions. If you have problems or questions, contact your health care provider. What can I expect after the procedure? After the procedure, it is common to have:  Mild discomfort.  Slight bruising.  Minor swelling.  Pain in the abdomen. Follow these instructions at home: Incision care   Follow instructions from your health care provider about how to take care of your incision area. Make sure you: ? Wash your hands with soap and water before you change your bandage (dressing). If soap and water are not available, use hand sanitizer. ? Change your dressing as told by your health care provider. ? Leave stitches (sutures), skin glue, or adhesive strips in place. These skin closures may need to stay in place for 2 weeks or longer. If adhesive strip edges start to loosen and curl up, you may trim the loose edges. Do not remove adhesive strips completely unless your health care provider tells you to do that.  Check your incision area every day for signs of infection. Check for: ? More redness, swelling, or pain. ? More fluid or blood. ? Warmth. ? Pus or a bad smell. Activity  Do not drive or use heavy machinery while taking prescription pain medicine. Do not drive until your health care provider approves.  Until your health care provider approves: ? Do not lift anything that is heavier than 10 lb (4.5 kg). ? Do not play contact sports.  Return to your normal activities as told by your health care provider. Ask your health care provider what activities are safe. General instructions  To prevent or treat constipation while you are taking prescription pain medicine, your health care provider may recommend that you: ? Drink enough fluid to keep your urine clear or pale yellow. ? Take over-the-counter or prescription medicines. ? Eat foods that are high in fiber, such as fresh fruits and vegetables, whole grains, and beans. ? Limit foods that are high in fat and processed  sugars, such as fried and sweet foods.  Take over-the-counter and prescription medicines only as told by your health care provider.  Do not take tub baths or go swimming until your health care provider approves.  Keep all follow-up visits as told by your health care provider. This is important. Contact a health care provider if:  You develop a rash.  You have more redness, swelling, or pain around your incision.  You have more fluid or blood coming from your incision.  Your incision feels warm to the touch.  You have pus or a bad smell coming from your incision.  You have a fever or chills.  You have blood in your stool (feces).  You have not had a bowel movement in 2-3 days.  Your pain is not controlled with medicine. Get help right away if:  You have chest pain or shortness of breath.  You feel light-headed or feel faint.  You have severe pain.  You vomit and your pain is worse. This information is not intended to replace advice given to you by your health care provider. Make sure you discuss any questions you have with  your health care provider. Document Revised: 05/10/2017 Document Reviewed: 11/09/2015 Elsevier Patient Education  2020 [employer].

## 2019-12-16 NOTE — Transfer of Care (Signed)
Immediate Anesthesia Transfer of Care Note  Patient: Robert Gillespie  Procedure(s) Performed: HERNIA REPAIR INGUINAL ADULT (Left Inguinal)  Patient Location: PACU  Anesthesia Type:General  Level of Consciousness: awake, alert , oriented and patient cooperative  Airway & Oxygen Therapy: Patient Spontanous Breathing and Patient connected to nasal cannula oxygen  Post-op Assessment: Report given to RN, Post -op Vital signs reviewed and stable and Patient moving all extremities  Post vital signs: Reviewed and stable  Last Vitals:  Vitals Value Taken Time  BP    Temp    Pulse 75 12/16/19 0836  Resp 12 12/16/19 0836  SpO2 100 % 12/16/19 0836  Vitals shown include unvalidated device data.  Last Pain:  Vitals:   12/16/19 0643  TempSrc: Oral  PainSc: 0-No pain      Patients Stated Pain Goal: 5 (22/02/54 2706)  Complications: No complications documented.

## 2019-12-16 NOTE — Anesthesia Procedure Notes (Signed)
Procedure Name: Intubation Performed by: Tacy Learn, CRNA Pre-anesthesia Checklist: Patient identified, Emergency Drugs available, Suction available, Patient being monitored and Timeout performed Patient Re-evaluated:Patient Re-evaluated prior to induction Oxygen Delivery Method: Circle system utilized Preoxygenation: Pre-oxygenation with 100% oxygen Induction Type: IV induction Ventilation: Mask ventilation without difficulty Laryngoscope Size: Glidescope and 3 Grade View: Grade I Tube type: Oral Tube size: 7.5 mm Number of attempts: 1 Airway Equipment and Method: Stylet Placement Confirmation: ETT inserted through vocal cords under direct vision,  positive ETCO2,  CO2 detector and breath sounds checked- equal and bilateral Secured at: 23 cm Tube secured with: Tape Dental Injury: Teeth and Oropharynx as per pre-operative assessment  Comments: DLx1, poor view, switched to glidescope uneventfully.

## 2019-12-16 NOTE — Interval H&P Note (Signed)
History and Physical Interval Note:  12/16/2019 7:10 AM  Robert Gillespie  has presented today for surgery, with the diagnosis of Left inguinal hernia.  The various methods of treatment have been discussed with the patient and family. After consideration of risks, benefits and other options for treatment, the patient has consented to  Procedure(s): HERNIA REPAIR INGUINAL ADULT (Left) as a surgical intervention.  The patient's history has been reviewed, patient examined, no change in status, stable for surgery.  I have reviewed the patient's chart and labs.  Questions were answered to the patient's satisfaction.     Aviva Signs

## 2019-12-16 NOTE — Anesthesia Postprocedure Evaluation (Signed)
Anesthesia Post Note  Patient: Robert Gillespie  Procedure(s) Performed: HERNIA REPAIR INGUINAL ADULT (Left Inguinal)  Patient location during evaluation: PACU Anesthesia Type: General Level of consciousness: awake, oriented, awake and alert and patient cooperative Pain management: pain level controlled Vital Signs Assessment: post-procedure vital signs reviewed and stable Respiratory status: spontaneous breathing, respiratory function stable and nonlabored ventilation Cardiovascular status: blood pressure returned to baseline and stable Postop Assessment: no headache and no backache Anesthetic complications: no   No complications documented.   Last Vitals:  Vitals:   12/16/19 0643 12/16/19 0703  BP: (!) 135/92 136/82  Pulse: 80   Resp: 15   Temp: 36.7 C   SpO2: 98%     Last Pain:  Vitals:   12/16/19 0643  TempSrc: Oral  PainSc: 0-No pain                 Tacy Learn

## 2019-12-17 ENCOUNTER — Encounter (HOSPITAL_COMMUNITY): Payer: Self-pay | Admitting: General Surgery

## 2019-12-23 ENCOUNTER — Telehealth (INDEPENDENT_AMBULATORY_CARE_PROVIDER_SITE_OTHER): Payer: Self-pay | Admitting: General Surgery

## 2019-12-23 DIAGNOSIS — Z09 Encounter for follow-up examination after completed treatment for conditions other than malignant neoplasm: Secondary | ICD-10-CM

## 2019-12-24 NOTE — Progress Notes (Signed)
Message left to call office if he had any questions or concerns.  Total time 2 minutes

## 2021-03-07 ENCOUNTER — Encounter: Payer: Self-pay | Admitting: Cardiology

## 2021-03-07 ENCOUNTER — Ambulatory Visit (INDEPENDENT_AMBULATORY_CARE_PROVIDER_SITE_OTHER): Payer: Medicare Other | Admitting: Cardiology

## 2021-03-07 VITALS — BP 118/80 | HR 72 | Ht 75.0 in | Wt 204.6 lb

## 2021-03-07 DIAGNOSIS — I4891 Unspecified atrial fibrillation: Secondary | ICD-10-CM | POA: Diagnosis not present

## 2021-03-07 NOTE — Progress Notes (Signed)
Clinical Summary Mr. Xiong is a 70 y.o.male seen today for follow up of the following medical problems.   1. PAF - reports told he had irregular beat during recent admission at [facility] 1 year ago. 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 No detected recurrences.   - Echo is [facility] care everyewhere    - complted heart monitor which showed episodes of afib with RVR. We started low dose metoprolol. Since that time no recurrent episodes.  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     - no recent palpitations - compliant with meds   2. 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    Past Medical History:  Diagnosis Date   Atrial fibrillation ([place])    Colorectal polyps    history of    Sleep apnea    Stroke ([place]) 2016     No Known Allergies   Current Outpatient Medications  Medication Sig Dispense Refill   HYDROcodone-acetaminophen (NORCO/VICODIN) 5-325 MG tablet Take 1 tablet by mouth every 6 (six) hours as needed for moderate pain.      metoprolol succinate (TOPROL-XL) 25 MG 24 hr tablet Take 25 mg by mouth every morning.     No current facility-administered medications for this visit.     Past Surgical History:  Procedure Laterality Date   BRAIN AVM REPAIR  2016   COLONOSCOPY  02/27/2012   Procedure: COLONOSCOPY;  Surgeon: Rogene Houston, MD;  Location: [facility];  Service: Endoscopy;  Laterality: N/A;  8:30   HERNIA REPAIR     INGUINAL HERNIA REPAIR Left 12/16/2019   Procedure: HERNIA REPAIR INGUINAL ADULT;  Surgeon: Aviva Signs, MD;  Location: [facility] ORS;  Service: General;  Laterality: Left;     No Known Allergies    Family  History  Problem Relation Age of Onset   Atrial fibrillation Mother    Heart disease Father    Hypertension Sister    Glaucoma Brother      Social History Mr. Brumett reports that he has never smoked. He has never used smokeless tobacco. Mr. Vincelette reports current alcohol use of about 6.0 - 8.0 standard drinks per week.   Review of Systems CONSTITUTIONAL: No weight loss, fever, chills, weakness or fatigue.  HEENT: Eyes: No visual loss, blurred vision, double vision or yellow sclerae.No hearing loss, sneezing, congestion, runny nose or sore throat.  SKIN: No rash or itching.  CARDIOVASCULAR: per hpi RESPIRATORY: No shortness of breath, cough or sputum.  GASTROINTESTINAL: No anorexia, nausea, vomiting or diarrhea. No abdominal pain or blood.  GENITOURINARY: No burning on urination, no polyuria NEUROLOGICAL: No headache, dizziness, syncope, paralysis, ataxia, numbness or tingling in the extremities. No change in bowel or bladder control.  MUSCULOSKELETAL: No muscle, back pain, joint pain or stiffness.  LYMPHATICS: No enlarged nodes. No history of splenectomy.  PSYCHIATRIC: No history of depression or anxiety.  ENDOCRINOLOGIC: No reports of sweating, cold or heat intolerance. No polyuria or polydipsia.  Marland Kitchen   Physical Examination Today's Vitals   03/07/21 0941  BP: 118/80  Pulse: 72  SpO2: 98%  Weight: 204 lb 9.6 oz (92.8  kg)  Height: 6\' 3"  (1.905 m)   Body mass index is 25.57 kg/m.  Gen: resting comfortably, no acute distress HEENT: no scleral icterus, pupils equal round and reactive, no palptable cervical adenopathy,  CV: irreg, no mrg, no jvd Resp: Clear to auscultation bilaterally GI: abdomen is soft, non-tender, non-distended, normal bowel sounds, no hepatosplenomegaly MSK: extremities are warm, no edema.  Skin: warm, no rash Neuro:  no focal deficits Psych: appropriate affect   Diagnostic Studies  10/2015 Event monitor Telemetry strips show sinus rhythm with  intermittent episodes of atrial fibrillation with rapid ventricular response to the 160s No symptoms reported   10/2014 Echo [facility] ECHOCARDIOGRAPHIC DESCRIPTIONS ----------------------------------------------- AORTIC ROOT Size: Normal Dissection: INDETERM FOR DISSECTION  AORTIC VALVE Leaflets: Tricuspid Morphology: Normal Mobility: Fully Mobile  LEFT VENTRICLE Anterior: Normal Size: Normal Lateral: Normal Contraction: Normal Septal: Normal Closest EF: >55% (Estimated) Apical: Normal LV masses: No Masses Inferior: Normal LVH: MILD LVH CONCENTRIC Posterior: Normal Dias.FxClass: RELAXATION ABNORMALITY (GRADE 1) CORRESPONDS TO E/A REVERSAL  MITRAL VALVE Leaflets: Normal Mobility: Fully mobile Morphology: Normal  LEFT ATRIUM Size: Normal LA masses: No masses Normal IAS  MAIN PA Size: Normal  PULMONIC VALVE Morphology: Normal Mobility: Fully Mobile  RIGHT VENTRICLE Size: Normal Free wall: Normal Contraction: Normal RV masses: No Masses  TRICUSPID VALVE Leaflets: Normal Mobility: Fully mobile Morphology: Normal  RIGHT ATRIUM Size: Normal RA Other: None RA masses: No masses  PERICARDIUM Fluid: No effusion  INFERIOR VENACAVA Size: Normal Normal respiratory collapse  DOPPLER ECHO and OTHER SPECIAL PROCEDURES ------------------------------------ Aortic: No AR No AS  Mitral: No MR No MS MV Inflow E Vel.= nm* cm/s MV Annulus E'Vel.= nm* cm/s E/E'Ratio= nm*  Tricuspid: TRIVIAL TR No TS  Pulmonary: TRIVIAL PR No PS  Other:  INTERPRETATION --------------------------------------------------------------- NORMAL LEFT VENTRICULAR SYSTOLIC FUNCTION WITH MILD LVH NORMAL LA PRESSURES WITH DIASTOLIC DYSFUNCTION NORMAL RIGHT VENTRICULAR SYSTOLIC FUNCTION VALVULAR REGURGITATION: TRIVIAL PR, TRIVIAL TR NO VALVULAR STENOSIS NO PRIOR STUDY FOR COMPARISON   Assessment and Plan   1. PAF - PAF, last ekg in our clinic showed afib, at recent pcp visit looked to be in afib  and EKG today shows rate controlled afib. Probably more so persistent afib at this time. E - no symptoms, he is rate controlled on low dose toprol - CHADS2Vasc score is 1. Can do either anticoag or nothing, with his prior head bleeding would not commit to anticoag. If stroke risk factors increase over time would need neuro clearance before considering anticoagulaition, as of now risk outweighs benefit with low CHADS2Vasc score.      Arnoldo Lenis, M.D.

## 2021-03-07 NOTE — Addendum Note (Signed)
Addended by: Merlene Laughter on: 03/07/2021 01:23 PM   Modules accepted: Orders

## 2021-03-07 NOTE — Patient Instructions (Addendum)
Medication Instructions:  Continue all current medications.   Labwork: none  Testing/Procedures: none  Follow-Up: 6 months   Any Other Special Instructions Will Be Listed Below (If Applicable).   If you need a refill on your cardiac medications before your next appointment, please call your pharmacy.  

## 2021-08-29 ENCOUNTER — Other Ambulatory Visit (HOSPITAL_COMMUNITY)
Admission: RE | Admit: 2021-08-29 | Discharge: 2021-08-29 | Disposition: A | Discharge status: Home / Self Care | Payer: Medicare Other | Source: Other Acute Inpatient Hospital | Attending: Family Medicine | Admitting: Family Medicine

## 2021-08-29 DIAGNOSIS — Z79891 Long term (current) use of opiate analgesic: Secondary | ICD-10-CM | POA: Diagnosis present

## 2021-08-29 LAB — RAPID URINE DRUG SCREEN, HOSP PERFORMED
Amphetamines: NOT DETECTED
Barbiturates: NOT DETECTED
Benzodiazepines: NOT DETECTED
Cocaine: NOT DETECTED
Opiates: POSITIVE — AB
Tetrahydrocannabinol: POSITIVE — AB

## 2021-09-05 ENCOUNTER — Ambulatory Visit: Payer: Medicare Other | Admitting: Cardiology

## 2022-03-06 ENCOUNTER — Ambulatory Visit: Payer: Medicare Other | Admitting: Cardiology

## 2022-10-04 ENCOUNTER — Telehealth: Payer: Self-pay | Admitting: Cardiology

## 2022-10-04 NOTE — Telephone Encounter (Signed)
Ok to increase toprol to  daily. Keep [place] updated  Dominga Ferry MD

## 2022-10-04 NOTE — Telephone Encounter (Signed)
Patient states that last month he saw his PCP and was told that his blood pressure was elevated. States that in the last 2-3 weeks, his heart has been beating faster with some palpitations and he can just feel that his blood pressure has been up. States that he was told by Dr. Wyline Mood that he starts having elevated BP readings that he could increase metoprolol from 25 mg to 50 mg. Please advise

## 2022-10-04 NOTE — Telephone Encounter (Signed)
Pt c/o medication issue:  1. Name of Medication: metoprolol succinate (TOPROL-XL) 25 MG 24 hr tablet   2. How are you currently taking this medication (dosage and times per day)? Take 25 mg by mouth every morning.   3. Are you having a reaction (difficulty breathing--STAT)? no  4. What is your medication issue? Patient would like his medication increase to . Please advise

## 2022-10-05 MED ORDER — METOPROLOL SUCCINATE ER 50 MG PO TB24: 50.0000 mg | ORAL_TABLET | Freq: Every morning | ORAL | 1 refills | Status: DC

## 2022-10-05 NOTE — Telephone Encounter (Signed)
Patient made aware that metoprolol has been increased to 50 mg once a day, per Dr. Wyline Mood. Prescription sent to [facility] as requested.

## 2022-10-05 NOTE — Telephone Encounter (Signed)
Patient returned call

## 2023-05-15 ENCOUNTER — Other Ambulatory Visit: Payer: Self-pay | Admitting: Urology

## 2023-05-15 DIAGNOSIS — R972 Elevated prostate specific antigen [PSA]: Secondary | ICD-10-CM

## 2023-05-30 ENCOUNTER — Telehealth: Payer: Self-pay | Admitting: Cardiology

## 2023-05-30 NOTE — Telephone Encounter (Signed)
Attempted to contact pt regarding medication- Unable to leave voicemail

## 2023-05-30 NOTE — Telephone Encounter (Signed)
Pt c/o medication issue:  1. Name of Medication:   metoprolol succinate (TOPROL-XL) 50 MG 24 hr tablet   2. How are you currently taking this medication (dosage and times per day)?   As prescribed  3. Are you having a reaction (difficulty breathing--STAT)?   4. What is your medication issue?   Patient stated he had been taking 50 mg of this medication for a long time.  Patient stated he recently changed PCP to Dr. Shelva Majestic and he increased patient's medication to 100 mg.  Patient wants a new prescription for this medication sent to [facility] - [place], [place] - [address]  Patient wants a call back to confirm updated prescription.

## 2023-05-30 NOTE — Telephone Encounter (Signed)
Request a prescription refill for Toprol xl 100 mg daily. Reports Dr. Sudie Bailey increased Toprol XL to 100 mg in July 2024. Since then, he has established with Dr. Scharlene Gloss office and toprol xl was listed as 50 mg daily; therefore they will not refill 100 mg tablet. Advised that he is overdue for follow and gave appointment to see [facility] 09/17/2022 due to patient requesting morning appointments only. Advised that message would be sent to provider. Verbalized understanding.

## 2023-05-31 NOTE — Telephone Encounter (Signed)
Patient informed and verbalized understanding of plan. Advised patient of this and we currently do not have any openings until the middle of January but we would give him a call if we had anything open up with elizabeth next week or weeks following. Patient is okay with this but does want to keep appointment with JB in April as well

## 2023-05-31 NOTE — Telephone Encounter (Signed)
Have not seen him in over 2 years, at last contact with [place] toprol was 50mg  daily. Difficult to green light a higher dose without having seen him. Can he get earlier appt with [facility] please.  Dominga Ferry MD

## 2023-06-18 ENCOUNTER — Ambulatory Visit
Admission: RE | Admit: 2023-06-18 | Discharge: 2023-06-18 | Disposition: A | Discharge status: Home / Self Care | Payer: Medicare Other | Source: Ambulatory Visit | Attending: Urology | Admitting: Urology

## 2023-06-18 ENCOUNTER — Other Ambulatory Visit: Payer: 59

## 2023-06-18 DIAGNOSIS — R972 Elevated prostate specific antigen [PSA]: Secondary | ICD-10-CM

## 2023-07-08 ENCOUNTER — Ambulatory Visit: Payer: Medicare Other | Admitting: Nurse Practitioner

## 2023-07-16 ENCOUNTER — Ambulatory Visit: Payer: Medicare Other | Admitting: Student

## 2023-08-07 ENCOUNTER — Ambulatory Visit: Payer: Medicare Other | Admitting: Cardiology

## 2023-09-17 ENCOUNTER — Ambulatory Visit: Payer: Medicare Other | Attending: Cardiology | Admitting: Cardiology

## 2023-09-17 ENCOUNTER — Encounter: Payer: Self-pay | Admitting: Cardiology

## 2023-09-17 VITALS — BP 116/78 | HR 74 | Ht 75.0 in | Wt 198.0 lb

## 2023-09-17 DIAGNOSIS — I4811 Longstanding persistent atrial fibrillation: Secondary | ICD-10-CM | POA: Insufficient documentation

## 2023-09-17 MED ORDER — METOPROLOL SUCCINATE ER 100 MG PO TB24: 100.0000 mg | ORAL_TABLET | Freq: Every day | ORAL | 3 refills | Status: AC

## 2023-09-17 NOTE — Progress Notes (Signed)
 Clinical Summary [name] is a 73 y.o.male seen today for follow up of the following medical problems.   1. Longstanding persistent afib - reports told he had irregular beat during recent admission at [facility] 1 year ago. 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 No detected recurrences.   - Echo is [facility] care everyewhere  - complted heart monitor which showed episodes of afib with RVR.  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    Past Medical History:  Diagnosis Date   Atrial fibrillation (HCC)    Colorectal polyps    history of    Sleep apnea    Stroke (HCC) 2016     No Known Allergies   Current Outpatient Medications  Medication Sig Dispense Refill   HYDROcodone-acetaminophen (NORCO/VICODIN) 5-325 MG tablet Take 1 tablet by mouth every 6 (six) hours as needed for moderate pain.      metoprolol succinate (TOPROL-XL) 50 MG 24 hr tablet Take 1 tablet (50 mg total) by mouth every morning. 90 tablet 1   No current facility-administered medications for this visit.     Past Surgical History:  Procedure Laterality Date   BRAIN AVM REPAIR  2016   COLONOSCOPY  02/27/2012   Procedure: COLONOSCOPY;  Surgeon: Malissa Hippo, MD;  Location: [facility];  Service: Endoscopy;  Laterality: N/A;  8:30   HERNIA REPAIR     INGUINAL HERNIA REPAIR Left 12/16/2019   Procedure: HERNIA REPAIR INGUINAL ADULT;  Surgeon: Franky Macho, MD;  Location: [facility] ORS;  Service:  General;  Laterality: Left;     No Known Allergies    Family History  Problem Relation Age of Onset   Atrial fibrillation Mother    Heart disease Father    Hypertension Sister    Glaucoma Brother      Social History [name] reports that he has never smoked. He has never used smokeless tobacco. [name] reports current alcohol use of about 6.0 - 8.0 standard drinks of alcohol per week.     Physical Examination Today's Vitals   09/17/23 0857  BP: 116/78  Pulse: 74  SpO2: 99%  Weight: 198 lb (89.8 kg)  Height: 6\' 3"  (1.905 m)   Body mass index is 24.75 kg/m.  Gen: resting comfortably, no acute distress HEENT: no scleral icterus, pupils equal round and reactive, no palptable cervical adenopathy,  CV: irreg, no m/rg, no jvd Resp: Clear to auscultation bilaterally GI: abdomen is soft, non-tender, non-distended, normal bowel sounds, no hepatosplenomegaly MSK: extremities are warm, no edema.  Skin: warm, no rash Neuro:  no focal deficits Psych: appropriate affect   Diagnostic Studies  10/2015 Event monitor Telemetry strips show sinus rhythm with intermittent episodes of atrial fibrillation with rapid ventricular response to the 160s No symptoms reported   10/2014 Echo [facility] ECHOCARDIOGRAPHIC DESCRIPTIONS ----------------------------------------------- AORTIC ROOT Size: Normal Dissection: INDETERM FOR DISSECTION  AORTIC VALVE Leaflets:  Tricuspid Morphology: Normal Mobility: Fully Mobile  LEFT VENTRICLE Anterior: Normal Size: Normal Lateral: Normal Contraction: Normal Septal: Normal Closest EF: >55% (Estimated) Apical: Normal LV masses: No Masses Inferior: Normal LVH: MILD LVH CONCENTRIC Posterior: Normal Dias.FxClass: RELAXATION ABNORMALITY (GRADE 1) CORRESPONDS TO E/A REVERSAL  MITRAL VALVE Leaflets: Normal Mobility: Fully mobile Morphology: Normal  LEFT ATRIUM Size: Normal LA masses: No masses Normal IAS  MAIN PA Size: Normal  PULMONIC  VALVE Morphology: Normal Mobility: Fully Mobile  RIGHT VENTRICLE Size: Normal Free wall: Normal Contraction: Normal RV masses: No Masses  TRICUSPID VALVE Leaflets: Normal Mobility: Fully mobile Morphology: Normal  RIGHT ATRIUM Size: Normal RA Other: None RA masses: No masses  PERICARDIUM Fluid: No effusion  INFERIOR VENACAVA Size: Normal Normal respiratory collapse  DOPPLER ECHO and OTHER SPECIAL PROCEDURES ------------------------------------ Aortic: No AR No AS  Mitral: No MR No MS MV Inflow E Vel.= nm* cm/s MV Annulus E'Vel.= nm* cm/s E/E'Ratio= nm*  Tricuspid: TRIVIAL TR No TS  Pulmonary: TRIVIAL PR No PS  Other:  INTERPRETATION --------------------------------------------------------------- NORMAL LEFT VENTRICULAR SYSTOLIC FUNCTION WITH MILD LVH NORMAL LA PRESSURES WITH DIASTOLIC DYSFUNCTION NORMAL RIGHT VENTRICULAR SYSTOLIC FUNCTION VALVULAR REGURGITATION: TRIVIAL PR, TRIVIAL TR NO VALVULAR STENOSIS NO PRIOR STUDY FOR COMPARISON     Assessment and Plan   1. Long standing persistent afib - EKG today shows rate controlled afib - no symptoms, continue toprol at 100mg  daily - CHADS2Vasc score is 1. With relatively low risk and prior cerebral hemorrage not on anticoag at this time.      Antoine Poche, M.D..

## 2023-09-17 NOTE — Patient Instructions (Signed)
 Medication Instructions:  Your physician has recommended you make the following change in your medication:   -Increase Toprol XL to 100 mg once daily   *If you need a refill on your cardiac medications before your next appointment, please call your pharmacy*  Lab Work: None If you have labs (blood work) drawn today and your tests are completely normal, you will receive your results only by: MyChart Message (if you have MyChart) OR A paper copy in the mail If you have any lab test that is abnormal or we need to change your treatment, we will call you to review the results.  Testing/Procedures: None  Follow-Up: At [facility], you and your health needs are our priority.  As part of our continuing mission to provide you with exceptional heart care, our providers are all part of one team.  This team includes your primary Cardiologist (physician) and Advanced Practice Providers or APPs (Physician Assistants and Nurse Practitioners) who all work together to provide you with the care you need, when you need it.  Your next appointment:   1 year(s)  Provider:   You may see Dina Rich, MD or one of the following Advanced Practice Providers on your designated Care Team:   Randall An, PA-C  Scotesia Binford, [place] Jacolyn Reedy, [place]     We recommend signing up for the patient portal called "MyChart".  Sign up information is provided on this After Visit Summary.  MyChart is used to connect with patients for Virtual Visits (Telemedicine).  Patients are able to view lab/test results, encounter notes, upcoming appointments, etc.  Non-urgent messages can be sent to your provider as well.   To learn more about what you can do with MyChart, go to [email or web address].   Other Instructions
